# Patient Record
Sex: Female | Born: 1980 | Race: Black or African American | Hispanic: No | Marital: Single | Smoking: Never smoker
Health system: Southern US, Community
[De-identification: ages and names within clinical notes are randomized; demographics above are authoritative.]

## PROBLEM LIST (undated history)

## (undated) ENCOUNTER — Inpatient Hospital Stay (HOSPITAL_COMMUNITY): Payer: Self-pay

## (undated) DIAGNOSIS — N838 Other noninflammatory disorders of ovary, fallopian tube and broad ligament: Secondary | ICD-10-CM

## (undated) DIAGNOSIS — Z789 Other specified health status: Secondary | ICD-10-CM

## (undated) DIAGNOSIS — D473 Essential (hemorrhagic) thrombocythemia: Secondary | ICD-10-CM

## (undated) DIAGNOSIS — R87619 Unspecified abnormal cytological findings in specimens from cervix uteri: Secondary | ICD-10-CM

## (undated) DIAGNOSIS — D75839 Thrombocytosis, unspecified: Secondary | ICD-10-CM

## (undated) DIAGNOSIS — R8789 Other abnormal findings in specimens from female genital organs: Secondary | ICD-10-CM

## (undated) DIAGNOSIS — D259 Leiomyoma of uterus, unspecified: Secondary | ICD-10-CM

## (undated) DIAGNOSIS — D219 Benign neoplasm of connective and other soft tissue, unspecified: Secondary | ICD-10-CM

## (undated) DIAGNOSIS — L918 Other hypertrophic disorders of the skin: Secondary | ICD-10-CM

## (undated) DIAGNOSIS — R87618 Other abnormal cytological findings on specimens from cervix uteri: Secondary | ICD-10-CM

## (undated) DIAGNOSIS — D649 Anemia, unspecified: Secondary | ICD-10-CM

## (undated) DIAGNOSIS — N939 Abnormal uterine and vaginal bleeding, unspecified: Secondary | ICD-10-CM

## (undated) DIAGNOSIS — R519 Headache, unspecified: Secondary | ICD-10-CM

## (undated) HISTORY — DX: Other abnormal cytological findings on specimens from cervix uteri: R87.618

## (undated) HISTORY — DX: Unspecified abnormal cytological findings in specimens from cervix uteri: R87.619

## (undated) HISTORY — DX: Thrombocytosis, unspecified: D75.839

## (undated) HISTORY — DX: Anemia, unspecified: D64.9

## (undated) HISTORY — DX: Other specified health status: Z78.9

## (undated) HISTORY — DX: Other hypertrophic disorders of the skin: L91.8

## (undated) HISTORY — PX: WISDOM TOOTH EXTRACTION: SHX21

## (undated) HISTORY — DX: Benign neoplasm of connective and other soft tissue, unspecified: D21.9

---
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
## 2002-07-17 ENCOUNTER — Other Ambulatory Visit: Admission: RE | Admit: 2002-07-17 | Discharge: 2002-07-17 | Payer: Self-pay | Admitting: *Deleted

## 2004-06-13 ENCOUNTER — Other Ambulatory Visit: Admission: RE | Admit: 2004-06-13 | Discharge: 2004-06-13 | Payer: Self-pay | Admitting: Obstetrics and Gynecology

## 2012-05-01 LAB — OB RESULTS CONSOLE ABO/RH: RH Type: POSITIVE

## 2012-05-01 LAB — OB RESULTS CONSOLE RPR: RPR: NONREACTIVE

## 2012-05-01 LAB — OB RESULTS CONSOLE HIV ANTIBODY (ROUTINE TESTING): HIV: NONREACTIVE

## 2012-05-01 LAB — OB RESULTS CONSOLE RUBELLA ANTIBODY, IGM: Rubella: IMMUNE

## 2012-05-01 LAB — OB RESULTS CONSOLE HEPATITIS B SURFACE ANTIGEN: Hepatitis B Surface Ag: NEGATIVE

## 2012-05-01 LAB — OB RESULTS CONSOLE ANTIBODY SCREEN: Antibody Screen: NEGATIVE

## 2012-06-21 ENCOUNTER — Encounter (HOSPITAL_COMMUNITY): Payer: Self-pay | Admitting: *Deleted

## 2012-06-21 ENCOUNTER — Inpatient Hospital Stay (HOSPITAL_COMMUNITY)
Admission: AD | Admit: 2012-06-21 | Discharge: 2012-06-22 | Disposition: A | Discharge status: Home / Self Care | Payer: BC Managed Care – PPO | Source: Ambulatory Visit | Attending: Obstetrics & Gynecology | Admitting: Obstetrics & Gynecology

## 2012-06-21 DIAGNOSIS — O210 Mild hyperemesis gravidarum: Secondary | ICD-10-CM | POA: Insufficient documentation

## 2012-06-21 DIAGNOSIS — R197 Diarrhea, unspecified: Secondary | ICD-10-CM | POA: Insufficient documentation

## 2012-06-21 DIAGNOSIS — R109 Unspecified abdominal pain: Secondary | ICD-10-CM | POA: Insufficient documentation

## 2012-06-21 DIAGNOSIS — K529 Noninfective gastroenteritis and colitis, unspecified: Secondary | ICD-10-CM

## 2012-06-21 DIAGNOSIS — O99891 Other specified diseases and conditions complicating pregnancy: Secondary | ICD-10-CM | POA: Insufficient documentation

## 2012-06-21 DIAGNOSIS — K5289 Other specified noninfective gastroenteritis and colitis: Secondary | ICD-10-CM | POA: Insufficient documentation

## 2012-06-21 HISTORY — DX: Other specified health status: Z78.9

## 2012-06-21 LAB — URINALYSIS, ROUTINE W REFLEX MICROSCOPIC
Bilirubin Urine: NEGATIVE
Glucose, UA: NEGATIVE mg/dL
Ketones, ur: >80 mg/dL — AB
pH: 6 (ref 5.0–8.0)

## 2012-06-21 MED ORDER — LACTATED RINGERS IV SOLN
25.0000 mg | Freq: Once | INTRAVENOUS | Status: DC
  Filled 2012-06-21: qty 1

## 2012-06-21 MED ORDER — LACTATED RINGERS IV SOLN: INTRAVENOUS | Status: DC

## 2012-06-21 NOTE — MAU Note (Signed)
Patient up to bathroom with assist to void.

## 2012-06-21 NOTE — MAU Note (Signed)
Throwing up since last night. Can't keep down anything. Some diarrhea from 0300-0800 today and none since.

## 2012-06-22 DIAGNOSIS — K5289 Other specified noninfective gastroenteritis and colitis: Secondary | ICD-10-CM

## 2012-06-22 MED ORDER — PROMETHAZINE HCL 25 MG PO TABS: 25.0000 mg | ORAL_TABLET | Freq: Four times a day (QID) | ORAL | Status: DC | PRN

## 2012-06-22 NOTE — Progress Notes (Signed)
Written and verbal d/c instructions given and understanding voiced. 

## 2012-06-22 NOTE — MAU Provider Note (Signed)
  History     CSN: [id number]  Arrival date and time: 06/21/12 1755   First Provider Initiated Contact with Patient 06/21/12 2115      Chief Complaint  Patient presents with  . Emesis During Pregnancy  . Leg Pain   HPI This is a 31 y.o. female at [redacted]w[redacted]d who presents with c/o vomiting since last night. Had some diarrhea today. No fever. Some abdominal cramping. No bleeding. No one else at home sick.   RN Note: Throwing up since last night. Can't keep down anything. Some diarrhea from 0300-0800 today and none since.       OB History    Grav Para Term Preterm Abortions TAB SAB Ect Mult Living   1               Past Medical History  Diagnosis Date  . No pertinent past medical history     No past surgical history on file.  No family history on file.  History  Substance Use Topics  . Smoking status: Never Smoker   . Smokeless tobacco: Not on file  . Alcohol Use: No    Allergies: No Known Allergies  No prescriptions prior to admission    ROS See HPI  Physical Exam   Blood pressure 139/75, pulse 113, temperature 98.5 F (36.9 C), temperature source Oral, resp. rate 20, height 5\' 7"  (1.702 m), weight 221 lb (100.245 kg).  Physical Exam  Constitutional: She is oriented to person, place, and time. She appears well-developed. No distress.  Cardiovascular: Normal rate.   Respiratory: Effort normal.  GI: Soft. She exhibits no distension and no mass. There is tenderness (diffuse, mild). There is no rebound and no guarding.  Musculoskeletal: Normal range of motion.  Neurological: She is alert and oriented to person, place, and time.  Skin: Skin is warm and dry.  Psychiatric: She has a normal mood and affect.    MAU Course  Procedures  MDM Given 2 bags of IVF, one with Phenergan in it.  Able to void with second bag. Feels somewhat better.   Assessment and Plan  A:  SIUP at [redacted]w[redacted]d       Gastroenteritis  P:  Discharge home      Rx Phenergan provided      PO  hydration.      followup in office with Dr Tamela Oddi  [facility] 06/22/2012, 12:21 AM

## 2012-07-31 NOTE — L&D Delivery Note (Signed)
Delivery Note At 3:27 PM a viable female was delivered via Vaginal, Spontaneous Delivery (Presentation: Right Occiput Anterior).  APGAR: 9, 9; weight:  2971 gms. .   Placenta status: Intact, Spontaneous.  Cord: 3 vessels with the following complications: None.  Cord pH: none   Anesthesia: Local  Episiotomy: None Lacerations: 2nd degree Suture Repair: 2.0 chromic Est. Blood Loss (mL): 350  Mom to postpartum.  Baby to nursery-stable.  Carder Yin A 11/18/2012, 3:58 PM

## 2012-08-13 LAB — OB RESULTS CONSOLE HGB/HCT, BLOOD
HCT: 34 %
Hemoglobin: 11.2 g/dL

## 2012-08-13 LAB — OB RESULTS CONSOLE HIV ANTIBODY (ROUTINE TESTING): HIV: NONREACTIVE

## 2012-08-13 LAB — OB RESULTS CONSOLE PLATELET COUNT: Platelets: 428 10*3/uL

## 2012-10-13 ENCOUNTER — Encounter: Payer: Self-pay | Admitting: *Deleted

## 2012-10-16 ENCOUNTER — Ambulatory Visit (INDEPENDENT_AMBULATORY_CARE_PROVIDER_SITE_OTHER): Payer: BC Managed Care – PPO | Admitting: Obstetrics & Gynecology

## 2012-10-16 VITALS — BP 125/79 | Temp 97.1°F | Wt 226.0 lb

## 2012-10-16 DIAGNOSIS — Z3483 Encounter for supervision of other normal pregnancy, third trimester: Secondary | ICD-10-CM

## 2012-10-16 DIAGNOSIS — Z3400 Encounter for supervision of normal first pregnancy, unspecified trimester: Secondary | ICD-10-CM

## 2012-10-16 DIAGNOSIS — Z3403 Encounter for supervision of normal first pregnancy, third trimester: Secondary | ICD-10-CM

## 2012-10-16 LAB — POCT URINALYSIS DIPSTICK
Ketones, UA: NEGATIVE
Leukocytes, UA: NEGATIVE
Nitrite, UA: NEGATIVE
Protein, UA: NEGATIVE
pH, UA: 6

## 2012-10-16 NOTE — Progress Notes (Signed)
Pulse: 88

## 2012-10-16 NOTE — Progress Notes (Signed)
  Subjective:    Stacy [name] is a 32 y.o. female being seen today for her obstetrical visit. She is at [redacted]w[redacted]d gestation. Patient reports no complaints. Fetal movement: normal.  Menstrual History: OB History   Grav Para Term Preterm Abortions TAB SAB Ect Mult Living   1                No LMP recorded. Patient is pregnant.    The following portions of the patient's history were reviewed and updated as appropriate: allergies, current medications, past family history, past medical history, past social history, past surgical history and problem list.  Review of Systems Pertinent items are noted in HPI.   Objective:    BP 125/79  Temp(Src) 97.1 F (36.2 C)  Wt 226 lb (102.513 kg)  BMI 35.39 kg/m2 FHT:  140 BPM  Uterine Size: 38 cm and size greater than dates  Presentation: cephalic     Assessment:    Pregnancy at [redacted]w[redacted]d, see above   Plan:  Growth U/S  Follow up in 2 Weeks.

## 2012-10-16 NOTE — Addendum Note (Signed)
Addended by: Glendell Docker on: 10/16/2012 05:36 PM   Modules accepted: Orders

## 2012-10-16 NOTE — Patient Instructions (Signed)
Prenatal Care  WHAT IS PRENATAL CARE?  Prenatal care means health care during your pregnancy, before your baby is born. Take care of yourself and your baby by:   Getting early prenatal care. If you know you are pregnant, or think you might be pregnant, call your caregiver as soon as possible. Schedule a visit for a general/prenatal examination.  Getting regular prenatal care. Follow your caregiver's schedule for blood and other necessary tests. Do not miss appointments.  Do everything you can to keep yourself and your baby healthy during your pregnancy.  Prenatal care should include evaluation of medical, dietary, educational, psychological, and social needs for the couple and the medical, surgical, and genetic history of the family of the mother and father.  Discuss with your caregiver:  Your medicines, prescription, over-the-counter, and herbal medicines.  Substance abuse, alcohol, smoking, and illegal drugs.  Domestic abuse and violence, if present.  Your immunizations.  Nutrition and diet.  Exercising.  Environment and occupational hazards, at home and at work.  History of sexually transmitted disease, both you and your partner.  Previous pregnancies. WHY IS PRENATAL CARE SO IMPORTANT?  By seeing you regularly, your caregiver has the chance to find problems early, so that they can be treated as soon as possible. Other problems might be prevented. Many studies have shown that early and regular prenatal care is important for the health of both mothers and their babies.  I AM THINKING ABOUT GETTING PREGNANT. HOW CAN I TAKE CARE OF MYSELF?  Taking care of yourself before you get pregnant helps you to have a healthy pregnancy. It also lowers your chances of having a baby born with a birth defect. Here are ways to take care of yourself before you get pregnant:   Eat healthy foods, exercise regularly (30 minutes per day for most days of the week is best), and get enough rest and  sleep. Talk to your caregiver about what kinds of foods and exercises are best for you.  Take 400 micrograms (mcg) of folic acid (one of the B vitamins) every day. The best way to do this is to take a daily multivitamin pill that contains this amount of folic acid. Getting enough of the synthetic (manufactured) form of folic acid every day before you get pregnant and during early pregnancy can help prevent certain birth defects. Many breakfast cereals and other grain products have folic acid added to them, but only certain cereals contain 400 mcg of folic acid per serving. Check the label on your multivitamin or cereal to find the amount of folic acid in the food.  See your caregiver for a complete check up before getting pregnant. Make sure that you have had all your immunization shots, especially for rubella ([place] measles). Rubella can cause serious birth defects. Chickenpox is another illness you want to avoid during pregnancy. If you have had chickenpox and rubella in the past, you should be immune to them.  Tell your caregiver about any prescription or non-prescription medicines (including herbal remedies) you are taking. Some medicines are not safe to take during pregnancy.  Stop smoking cigarettes, drinking alcohol, or taking illegal drugs. Ask your caregiver for help, if you need it. You can also get help with alcohol and drugs by talking with a member of your faith community, a counselor, or a trusted friend.  Discuss and treat any medical, social, or psychological problems before getting pregnant.  Discuss any history of genetic problems in the mother, father, and their families. Do  genetic testing before getting pregnant, when possible.  Discuss any physical or emotional abuse with your caregiver.  Discuss with your caregiver if you might be exposed to harmful chemicals on your job or where you live.  Discuss with your caregiver if you think your job or the hours you work may be  harmful and should be changed.  The father should be involved with the decision making and with all aspects of the pregnancy, labor, and delivery.  If you have medical insurance, make sure you are covered for pregnancy. I JUST FOUND OUT THAT I AM PREGNANT. HOW CAN I TAKE CARE OF MYSELF?  Here are ways to take care of yourself and the precious new life growing inside you:   Continue taking your multivitamin with 400 micrograms (mcg) of folic acid every day.  Get early and regular prenatal care. It does not matter if this is your first pregnancy or if you already have children. It is very important to see a caregiver during your pregnancy. Your caregiver will check at each visit to make sure that you and the baby are healthy. If there are any problems, action can be taken right away to help you and the baby.  Eat a healthy diet that includes:  Fruits.  Vegetables.  Foods low in saturated fat.  Grains.  Calcium-rich foods.  Drink 6 to 8 glasses of liquids a day.  Unless your caregiver tells you not to, try to be physically active for 30 minutes, most days of the week. If you are pressed for time, you can get your activity in through 10 minute segments, three times a day.  If you smoke, drink alcohol, or use drugs, STOP. These can cause long-term damage to your baby. Talk with your caregiver about steps to take to stop smoking. Talk with a member of your faith community, a counselor, a trusted friend, or your caregiver if you are concerned about your alcohol or drug use.  Ask your caregiver before taking any medicine, even over-the-counter medicines. Some medicines are not safe to take during pregnancy.  Get plenty of rest and sleep.  Avoid hot tubs and saunas during pregnancy.  Do not have X-rays taken, unless absolutely necessary and with the recommendation of your caregiver. A lead shield can be placed on your abdomen, to protect the baby when X-rays are taken in other parts of the  body.  Do not empty the cat litter when you are pregnant. It may contain a parasite that causes an infection called toxoplasmosis, which can cause birth defects. Also, use gloves when working in garden areas used by cats.  Do not eat uncooked or undercooked cheese, meats, or fish.  Stay away from toxic chemicals like:  Insecticides.  Solvents (some cleaners or paint thinners).  Lead.  Mercury.  Sexual relations may continue until the end of the pregnancy, unless you have a medical problem or there is a problem with the pregnancy and your caregiver tells you not to.  Do not wear high heel shoes, especially during the second half of the pregnancy. You can lose your balance and fall.  Do not take long trips, unless absolutely necessary. Be sure to see your caregiver before going on the trip.  Do not sit in one position for more than 2 hours, when on a trip.  Take a copy of your medical records when going on a trip.  Know where there is a hospital in the city you are visiting, in case of an  emergency.  Most dangerous household products will have pregnancy warnings on their labels. Ask your caregiver about products if you are unsure.  Limit or eliminate your caffeine intake from coffee, tea, sodas, medicines, and chocolate.  Many women continue working through pregnancy. Staying active might help you stay healthier. If you have a question about the safety or the hours you work at your particular job, talk with your caregiver.  Get informed:  Read books.  Watch videos.  Go to childbirth classes for you and the father.  Talk with experienced moms.  Ask your caregiver about childbirth education classes for you and your partner. Classes can help you and your partner prepare for the birth of your baby.  Ask about a pediatrician (baby doctor) and methods and pain medicine for labor, delivery, and possible Cesarean delivery (C-section). I AM NOT THINKING ABOUT GETTING PREGNANT  RIGHT NOW, BUT HEARD THAT ALL WOMEN SHOULD TAKE FOLIC ACID EVERY DAY?  All women of childbearing age, with even a remote chance of getting pregnant, should try to make sure they get enough folic acid. Many pregnancies are not planned. Many women do not know they are actually pregnant early in their pregnancies, and certain birth defects happen in the very early part of pregnancy. Taking 400 micrograms (mcg) of folic acid every day will help prevent certain birth defects that happen in the early part of pregnancy. If a woman begins taking vitamin pills in the second or third month of pregnancy, it may be too late to prevent birth defects. Folic acid may also have other health benefits for women, besides preventing birth defects.  HOW OFTEN SHOULD I SEE MY CAREGIVER DURING PREGNANCY?  Your caregiver will give you a schedule for your prenatal visits. You will have visits more often as you get closer to the end of your pregnancy. An average pregnancy lasts about 40 weeks.  A typical schedule includes visiting your caregiver:   About once each month, during your first 6 months of pregnancy.  Every 2 weeks, during the next 2 months.  Weekly in the last month, until the delivery date. Your caregiver will probably want to see you more often if:  You are over 35.  Your pregnancy is high risk, because you have certain health problems or problems with the pregnancy, such as:  Diabetes.  High blood pressure.  The baby is not growing on schedule, according to the dates of the pregnancy. Your caregiver will do special tests, to make sure you and the baby are not having any serious problems. WHAT HAPPENS DURING PRENATAL VISITS?   At your first prenatal visit, your caregiver will talk to you about you and your partner's health history and your family's health history, and will do a physical exam.  On your first visit, a physical exam will include checks of your blood pressure, height and weight, and an  exam of your pelvic organs. Your caregiver will do a Pap test if you have not had one recently, and will do cultures of your cervix to make sure there is no infection.  At each visit, there will be tests of your blood, urine, blood pressure, weight, and checking the progress of the baby.  Your caregiver will be able to tell you when to expect that your baby will be born.  Each visit is also a chance for you to learn about staying healthy during pregnancy and for asking questions.  Discuss whether you will be breastfeeding.  At your later prenatal  visits, your caregiver will check how you are doing and how the baby is developing. You may have a number of tests done as your pregnancy progresses.  Ultrasound exams are often used to check on the baby's growth and health.  You may have more urine and blood tests, as well as special tests, if needed. These may include amniocentesis (examine fluid in the pregnancy sac), stress tests (check how baby responds to contractions), biophysical profile (measures fetus well-being). Your caregiver will explain the tests and why they are necessary. I AM IN MY LATE THIRTIES, AND I WANT TO HAVE A CHILD NOW. SHOULD I DO ANYTHING SPECIAL?  As you get older, there is more chance of having a medical problem (high blood pressure), pregnancy problem (preeclampsia, problems with the placenta), miscarriage, or a baby born with a birth defect. However, most women in their late thirties and early forties have healthy babies. See your caregiver on a regular basis before you get pregnant and be sure to go for exams throughout your pregnancy. Your caregiver probably will want to do some special tests to check on you and your baby's health when you are pregnant.  Women today are often delaying having children until later in life, when they are in their thirties and forties. While many women in their thirties and forties have no difficulty getting pregnant, fertility does decline  with age. For women over 40 who cannot get pregnant after 6 months of trying, it is recommended that they see their caregiver for a fertility evaluation. It is not uncommon to have trouble becoming pregnant or experience infertility (inability to become pregnant after trying for one year). If you think that you or your partner may be infertile, you can discuss this with your caregiver. He or she can recommend treatments such as drugs, surgery, or assisted reproductive technology.  Document Released: 07/20/2003 Document Revised: 10/09/2011 Document Reviewed: 06/16/2009 [facility] Patient Information 2013 [place], [place]. Place 32-42 weeks prenatal visit patient instructions here.

## 2012-10-28 ENCOUNTER — Encounter: Payer: BC Managed Care – PPO | Admitting: Obstetrics & Gynecology

## 2012-10-30 ENCOUNTER — Ambulatory Visit (INDEPENDENT_AMBULATORY_CARE_PROVIDER_SITE_OTHER): Payer: BC Managed Care – PPO | Admitting: Obstetrics & Gynecology

## 2012-10-30 ENCOUNTER — Encounter: Payer: Self-pay | Admitting: Obstetrics & Gynecology

## 2012-10-30 ENCOUNTER — Ambulatory Visit (INDEPENDENT_AMBULATORY_CARE_PROVIDER_SITE_OTHER): Payer: BC Managed Care – PPO

## 2012-10-30 VITALS — BP 135/84 | Temp 97.8°F | Wt 227.2 lb

## 2012-10-30 DIAGNOSIS — Z3403 Encounter for supervision of normal first pregnancy, third trimester: Secondary | ICD-10-CM

## 2012-10-30 DIAGNOSIS — Z3400 Encounter for supervision of normal first pregnancy, unspecified trimester: Secondary | ICD-10-CM

## 2012-10-30 DIAGNOSIS — Z3483 Encounter for supervision of other normal pregnancy, third trimester: Secondary | ICD-10-CM

## 2012-10-30 DIAGNOSIS — O3660X0 Maternal care for excessive fetal growth, unspecified trimester, not applicable or unspecified: Secondary | ICD-10-CM

## 2012-10-30 LAB — POCT URINALYSIS DIPSTICK
Bilirubin, UA: NEGATIVE
Blood, UA: NEGATIVE
Nitrite, UA: NEGATIVE
pH, UA: 6

## 2012-10-30 NOTE — Progress Notes (Signed)
Doing well 

## 2012-10-30 NOTE — Progress Notes (Signed)
Pulse: 88

## 2012-10-30 NOTE — Patient Instructions (Addendum)
Patient information: Group B streptococcus and pregnancy (Beyond the Basics)  Authors Karen M Puopolo, MD, PhD Carol J Baker, MD Section Editors Charles J Lockwood, MD Daniel J Sexton, MD Deputy Editor Vanessa A Barss, MD Disclosures  All topics are updated as new evidence becomes available and our peer review process is complete.  Literature review current through: Feb 2014.  This topic last updated: Jan 29, 2012.  INTRODUCTION - Group B streptococcus (GBS) is a bacterium that can cause serious infections in pregnant women and newborn babies. GBS is one of many types of streptococcal bacteria, sometimes called "strep." This article discusses GBS, its effect on pregnant women and infants, and ways to prevent complications of GBS. More detailed information about GBS is available by subscription. (See "Group B streptococcal infection in pregnant women".) WHAT IS GROUP B STREP INFECTION? - GBS is commonly found in the digestive system and the vagina. In healthy adults, GBS is not harmful and does not cause problems. But in pregnant women and newborn infants, being infected with GBS can cause serious illness. Approximately one in three to four pregnant women in the [place] carries GBS in their gastrointestinal system and/or in their vagina. Carrying GBS is not the same as being infected. Carriers are not sick and do not need treatment during pregnancy. There is no treatment that can stop you from carrying GBS.  Pregnant women who are carriers of GBS infrequently become infected with GBS. GBS can cause urinary tract infections, infection of the amniotic fluid (bag of water), and infection of the uterus after delivery. GBS infections during pregnancy may lead to preterm labor.  Pregnant women who carry GBS can pass on the bacteria to their newborns, and some of those babies become infected with GBS. Newborns who are infected with GBS can develop pneumonia (lung infection), septicemia (blood infection), or  meningitis (infection of the lining of the brain and spinal cord). These complications can be prevented by giving intravenous antibiotics during labor to any woman who is at risk of GBS infection. You are at risk of GBS infection if: You have a urine culture during your current pregnancy showing GBS  You have a vaginal and rectal culture during your current pregnancy showing GBS  You had an infant infected with GBS in the past GROUP B STREP PREVENTION - Most doctors and nurses recommend a urine culture early in your pregnancy to be sure that you do not have a bladder infection without symptoms. If you urine culture shows GBS or other bacteria, you may be treated with an antibiotic. If you have symptoms of urinary infection, such as pain with urination, any time during your pregnancy, a urine culture is done. If GBS grows from the urine culture, it should be treated with an antibiotic, and you should also receive intravenous antibiotics during labor. Expert groups recommend that all pregnant women have a GBS culture at 35 to 37 weeks of pregnancy. The culture is done by swabbing the vagina and rectum. If your GBS culture is positive, you will be given an intravenous antibiotic during labor. If you have preterm labor, the culture is done then and an intravenous antibiotic is given until the baby is born or the labor is stopped by your health care provider. If you have a positive GBS culture and you have an allergy to penicillin, be sure your doctor and nurse are aware of this allergy and tell them what happened with the allergy. If you had only a rash or itching, this   is not a serious allergy, and you can receive a common drug related to the penicillin. If you had a serious allergy (for example, trouble breathing, swelling of your face) you may need an additional test to determine which antibiotic should be used during labor. Being treated with an antibiotic during labor greatly reduces the chance that you or  your newborn will develop infections related to GBS. It is important to note that young infants up to age 3 months can also develop septicemia, meningitis and other serious infections from GBS. Being treated with an antibiotic during labor does not reduce the chance that your baby will develop this later type of infection. There is currently no known way of preventing this later-onset GBS disease. WHERE TO GET MORE INFORMATION - Your healthcare provider is the best source of information for questions and concerns related to your medical problem.  

## 2012-10-31 ENCOUNTER — Telehealth: Payer: Self-pay | Admitting: *Deleted

## 2012-10-31 NOTE — Telephone Encounter (Signed)
Spoke to patient explain the reason for hospital [place] and the reason for the mix up with notification of need for appointment. (Working in new system- doctor unaware message not sent to staff) Patient understands. Apology accepted.

## 2012-11-04 ENCOUNTER — Encounter: Payer: Self-pay | Admitting: Obstetrics & Gynecology

## 2012-11-04 ENCOUNTER — Ambulatory Visit (HOSPITAL_COMMUNITY)
Admission: RE | Admit: 2012-11-04 | Discharge: 2012-11-04 | Disposition: A | Discharge status: Home / Self Care | Payer: BC Managed Care – PPO | Source: Ambulatory Visit | Attending: Obstetrics & Gynecology | Admitting: Obstetrics & Gynecology

## 2012-11-04 DIAGNOSIS — O365990 Maternal care for other known or suspected poor fetal growth, unspecified trimester, not applicable or unspecified: Secondary | ICD-10-CM | POA: Insufficient documentation

## 2012-11-04 DIAGNOSIS — Z3689 Encounter for other specified antenatal screening: Secondary | ICD-10-CM | POA: Insufficient documentation

## 2012-11-05 ENCOUNTER — Encounter: Payer: Self-pay | Admitting: Obstetrics & Gynecology

## 2012-11-05 LAB — US OB DETAIL + 14 WK

## 2012-11-06 ENCOUNTER — Ambulatory Visit (INDEPENDENT_AMBULATORY_CARE_PROVIDER_SITE_OTHER): Payer: BC Managed Care – PPO | Admitting: Obstetrics & Gynecology

## 2012-11-06 ENCOUNTER — Other Ambulatory Visit (INDEPENDENT_AMBULATORY_CARE_PROVIDER_SITE_OTHER): Payer: BC Managed Care – PPO

## 2012-11-06 ENCOUNTER — Ambulatory Visit: Payer: BC Managed Care – PPO | Admitting: Obstetrics & Gynecology

## 2012-11-06 ENCOUNTER — Other Ambulatory Visit: Payer: Self-pay | Admitting: Obstetrics & Gynecology

## 2012-11-06 VITALS — BP 119/86 | Temp 98.1°F | Wt 229.8 lb

## 2012-11-06 DIAGNOSIS — O288 Other abnormal findings on antenatal screening of mother: Secondary | ICD-10-CM

## 2012-11-06 DIAGNOSIS — Z3403 Encounter for supervision of normal first pregnancy, third trimester: Secondary | ICD-10-CM

## 2012-11-06 DIAGNOSIS — O289 Unspecified abnormal findings on antenatal screening of mother: Secondary | ICD-10-CM

## 2012-11-06 DIAGNOSIS — Z3400 Encounter for supervision of normal first pregnancy, unspecified trimester: Secondary | ICD-10-CM

## 2012-11-06 LAB — POCT URINALYSIS DIPSTICK
Blood, UA: NEGATIVE
Glucose, UA: NEGATIVE
Nitrite, UA: NEGATIVE
Urobilinogen, UA: NEGATIVE
pH, UA: 6

## 2012-11-06 NOTE — Progress Notes (Signed)
Minimallly reactive NST.

## 2012-11-06 NOTE — Progress Notes (Signed)
Pulse: 90

## 2012-11-07 ENCOUNTER — Encounter (HOSPITAL_COMMUNITY): Payer: Self-pay | Admitting: *Deleted

## 2012-11-07 ENCOUNTER — Telehealth (HOSPITAL_COMMUNITY): Payer: Self-pay | Admitting: *Deleted

## 2012-11-07 NOTE — Telephone Encounter (Signed)
Preadmission screen  

## 2012-11-08 ENCOUNTER — Other Ambulatory Visit: Payer: Self-pay | Admitting: *Deleted

## 2012-11-11 ENCOUNTER — Ambulatory Visit (INDEPENDENT_AMBULATORY_CARE_PROVIDER_SITE_OTHER): Payer: BC Managed Care – PPO | Admitting: Obstetrics & Gynecology

## 2012-11-11 ENCOUNTER — Encounter: Payer: Self-pay | Admitting: *Deleted

## 2012-11-11 VITALS — BP 120/79 | Temp 97.8°F | Wt 236.0 lb

## 2012-11-11 DIAGNOSIS — O365990 Maternal care for other known or suspected poor fetal growth, unspecified trimester, not applicable or unspecified: Secondary | ICD-10-CM

## 2012-11-11 DIAGNOSIS — O0990 Supervision of high risk pregnancy, unspecified, unspecified trimester: Secondary | ICD-10-CM

## 2012-11-11 LAB — POCT URINALYSIS DIPSTICK
Bilirubin, UA: NEGATIVE
Blood, UA: NEGATIVE
Glucose, UA: NEGATIVE
Ketones, UA: NEGATIVE
Nitrite, UA: NEGATIVE
Spec Grav, UA: 1.01

## 2012-11-11 NOTE — Progress Notes (Signed)
P-90 

## 2012-11-11 NOTE — Patient Instructions (Addendum)
Fetal Movement Counts Patient Name: __________________________________________________ Patient Due Date: ____________________ Kick counts is highly recommended in high risk pregnancies, but it is a good idea for every pregnant woman to do. Start counting fetal movements at 28 weeks of the pregnancy. Fetal movements increase after eating a full meal or eating or drinking something sweet (the blood sugar is higher). It is also important to drink plenty of fluids (well hydrated) before doing the count. Lie on your left side because it helps with the circulation or you can sit in a comfortable chair with your arms over your belly (abdomen) with no distractions around you. DOING THE COUNT  Try to do the count the same time of day each time you do it.  Mark the day and time, then see how long it takes for you to feel 10 movements (kicks, flutters, swishes, rolls). You should have at least 10 movements within 2 hours. You will most likely feel 10 movements in much less than 2 hours. If you do not, wait an hour and count again. After a couple of days you will see a pattern.  What you are looking for is a change in the pattern or not enough counts in 2 hours. Is it taking longer in time to reach 10 movements? SEEK MEDICAL CARE IF:  You feel less than 10 counts in 2 hours. Tried twice.  No movement in one hour.  The pattern is changing or taking longer each day to reach 10 counts in 2 hours.  You feel the baby is not moving as it usually does. Date: ____________ Movements: ____________ Start time: ____________ Finish time: ____________  Date: ____________ Movements: ____________ Start time: ____________ Finish time: ____________ Date: ____________ Movements: ____________ Start time: ____________ Finish time: ____________ Date: ____________ Movements: ____________ Start time: ____________ Finish time: ____________ Date: ____________ Movements: ____________ Start time: ____________ Finish time:  ____________ Date: ____________ Movements: ____________ Start time: ____________ Finish time: ____________ Date: ____________ Movements: ____________ Start time: ____________ Finish time: ____________ Date: ____________ Movements: ____________ Start time: ____________ Finish time: ____________  Date: ____________ Movements: ____________ Start time: ____________ Finish time: ____________ Date: ____________ Movements: ____________ Start time: ____________ Finish time: ____________ Date: ____________ Movements: ____________ Start time: ____________ Finish time: ____________ Date: ____________ Movements: ____________ Start time: ____________ Finish time: ____________ Date: ____________ Movements: ____________ Start time: ____________ Finish time: ____________ Date: ____________ Movements: ____________ Start time: ____________ Finish time: ____________ Date: ____________ Movements: ____________ Start time: ____________ Finish time: ____________  Date: ____________ Movements: ____________ Start time: ____________ Finish time: ____________ Date: ____________ Movements: ____________ Start time: ____________ Finish time: ____________ Date: ____________ Movements: ____________ Start time: ____________ Finish time: ____________ Date: ____________ Movements: ____________ Start time: ____________ Finish time: ____________ Date: ____________ Movements: ____________ Start time: ____________ Finish time: ____________ Date: ____________ Movements: ____________ Start time: ____________ Finish time: ____________ Date: ____________ Movements: ____________ Start time: ____________ Finish time: ____________  Date: ____________ Movements: ____________ Start time: ____________ Finish time: ____________ Date: ____________ Movements: ____________ Start time: ____________ Finish time: ____________ Date: ____________ Movements: ____________ Start time: ____________ Finish time: ____________ Date: ____________ Movements:  ____________ Start time: ____________ Finish time: ____________ Date: ____________ Movements: ____________ Start time: ____________ Finish time: ____________ Date: ____________ Movements: ____________ Start time: ____________ Finish time: ____________ Date: ____________ Movements: ____________ Start time: ____________ Finish time: ____________  Date: ____________ Movements: ____________ Start time: ____________ Finish time: ____________ Date: ____________ Movements: ____________ Start time: ____________ Finish time: ____________ Date: ____________ Movements: ____________ Start time: ____________ Finish time: ____________ Date: ____________ Movements:   ____________ Start time: ____________ Finish time: ____________ Date: ____________ Movements: ____________ Start time: ____________ Finish time: ____________ Date: ____________ Movements: ____________ Start time: ____________ Finish time: ____________ Date: ____________ Movements: ____________ Start time: ____________ Finish time: ____________  Date: ____________ Movements: ____________ Start time: ____________ Finish time: ____________ Date: ____________ Movements: ____________ Start time: ____________ Finish time: ____________ Date: ____________ Movements: ____________ Start time: ____________ Finish time: ____________ Date: ____________ Movements: ____________ Start time: ____________ Finish time: ____________ Date: ____________ Movements: ____________ Start time: ____________ Finish time: ____________ Date: ____________ Movements: ____________ Start time: ____________ Finish time: ____________ Date: ____________ Movements: ____________ Start time: ____________ Finish time: ____________  Date: ____________ Movements: ____________ Start time: ____________ Finish time: ____________ Date: ____________ Movements: ____________ Start time: ____________ Finish time: ____________ Date: ____________ Movements: ____________ Start time: ____________ Finish  time: ____________ Date: ____________ Movements: ____________ Start time: ____________ Finish time: ____________ Date: ____________ Movements: ____________ Start time: ____________ Finish time: ____________ Date: ____________ Movements: ____________ Start time: ____________ Finish time: ____________ Date: ____________ Movements: ____________ Start time: ____________ Finish time: ____________  Date: ____________ Movements: ____________ Start time: ____________ Finish time: ____________ Date: ____________ Movements: ____________ Start time: ____________ Finish time: ____________ Date: ____________ Movements: ____________ Start time: ____________ Finish time: ____________ Date: ____________ Movements: ____________ Start time: ____________ Finish time: ____________ Date: ____________ Movements: ____________ Start time: ____________ Finish time: ____________ Date: ____________ Movements: ____________ Start time: ____________ Finish time: ____________ Document Released: 08/16/2006 Document Revised: 10/09/2011 Document Reviewed: 02/16/2009 [employer] Patient Information 2013 [employer].  

## 2012-11-12 ENCOUNTER — Encounter: Payer: Self-pay | Admitting: Obstetrics & Gynecology

## 2012-11-12 LAB — CBC
HCT: 29.9 % — ABNORMAL LOW (ref 36.0–46.0)
MCH: 27.8 pg (ref 26.0–34.0)
MCHC: 32.8 g/dL (ref 30.0–36.0)
MCV: 84.7 fL (ref 78.0–100.0)
RDW: 14.4 % (ref 11.5–15.5)

## 2012-11-12 LAB — PROTEIN / CREATININE RATIO, URINE
Creatinine, Urine: 85 mg/dL
Protein Creatinine Ratio: 0.06 (ref <0.15)
Total Protein, Urine: 5 mg/dL

## 2012-11-12 NOTE — Progress Notes (Signed)
NST reassuring.  Check PIH labs.

## 2012-11-14 ENCOUNTER — Ambulatory Visit (INDEPENDENT_AMBULATORY_CARE_PROVIDER_SITE_OTHER): Payer: BC Managed Care – PPO | Admitting: Obstetrics

## 2012-11-14 ENCOUNTER — Encounter: Payer: Self-pay | Admitting: Obstetrics

## 2012-11-14 VITALS — BP 139/76 | Temp 97.3°F | Wt 232.0 lb

## 2012-11-14 DIAGNOSIS — Z3400 Encounter for supervision of normal first pregnancy, unspecified trimester: Secondary | ICD-10-CM

## 2012-11-14 DIAGNOSIS — O365990 Maternal care for other known or suspected poor fetal growth, unspecified trimester, not applicable or unspecified: Secondary | ICD-10-CM

## 2012-11-14 LAB — POCT URINALYSIS DIPSTICK
Bilirubin, UA: NEGATIVE
Glucose, UA: NEGATIVE
Ketones, UA: NEGATIVE
Spec Grav, UA: 1.015
Urobilinogen, UA: NEGATIVE

## 2012-11-14 NOTE — Progress Notes (Signed)
Pulse-74 No complaints. 

## 2012-11-17 ENCOUNTER — Encounter (HOSPITAL_COMMUNITY): Payer: Self-pay

## 2012-11-17 ENCOUNTER — Inpatient Hospital Stay (HOSPITAL_COMMUNITY)
Admission: RE | Admit: 2012-11-17 | Discharge: 2012-11-20 | DRG: 372 | Disposition: A | Discharge status: Home / Self Care | Payer: BC Managed Care – PPO | Source: Ambulatory Visit | Attending: Obstetrics & Gynecology | Admitting: Obstetrics & Gynecology

## 2012-11-17 DIAGNOSIS — O365990 Maternal care for other known or suspected poor fetal growth, unspecified trimester, not applicable or unspecified: Principal | ICD-10-CM | POA: Diagnosis present

## 2012-11-17 DIAGNOSIS — O99810 Abnormal glucose complicating pregnancy: Secondary | ICD-10-CM | POA: Diagnosis present

## 2012-11-17 DIAGNOSIS — Z22330 Carrier of Group B streptococcus: Secondary | ICD-10-CM

## 2012-11-17 DIAGNOSIS — O99892 Other specified diseases and conditions complicating childbirth: Secondary | ICD-10-CM | POA: Diagnosis present

## 2012-11-17 DIAGNOSIS — O139 Gestational [pregnancy-induced] hypertension without significant proteinuria, unspecified trimester: Secondary | ICD-10-CM | POA: Diagnosis present

## 2012-11-17 LAB — COMPREHENSIVE METABOLIC PANEL
ALT: 13 U/L (ref 0–35)
Albumin: 2.6 g/dL — ABNORMAL LOW (ref 3.5–5.2)
Calcium: 9.1 mg/dL (ref 8.4–10.5)
GFR calc Af Amer: >90 mL/min (ref >90)
Glucose, Bld: 79 mg/dL (ref 70–99)
Sodium: 135 mEq/L (ref 135–145)
Total Protein: 6.2 g/dL (ref 6.0–8.3)

## 2012-11-17 LAB — URIC ACID: Uric Acid, Serum: 4.3 mg/dL (ref 2.4–7.0)

## 2012-11-17 LAB — LACTATE DEHYDROGENASE: LDH: 161 U/L (ref 94–250)

## 2012-11-17 LAB — CBC
HCT: 30.5 % — ABNORMAL LOW (ref 36.0–46.0)
Hemoglobin: 9.9 g/dL — ABNORMAL LOW (ref 12.0–15.0)
MCH: 28 pg (ref 26.0–34.0)
MCHC: 32.5 g/dL (ref 30.0–36.0)
MCV: 86.2 fL (ref 78.0–100.0)
Platelets: 339 10*3/uL (ref 150–400)
RBC: 3.54 MIL/uL — ABNORMAL LOW (ref 3.87–5.11)
RDW: 14 % (ref 11.5–15.5)
WBC: 8.1 10*3/uL (ref 4.0–10.5)

## 2012-11-17 MED ORDER — BUTORPHANOL TARTRATE 1 MG/ML IJ SOLN
1.0000 mg | INTRAMUSCULAR | Status: DC | PRN
  Administered 2012-11-18: 1 mg via INTRAVENOUS
  Filled 2012-11-17: qty 1

## 2012-11-17 MED ORDER — MAGNESIUM SULFATE 40 G IN LACTATED RINGERS - SIMPLE
2.0000 g/h | INTRAVENOUS | Status: DC
  Administered 2012-11-18: 2 g/h via INTRAVENOUS
  Filled 2012-11-17 (×2): qty 500

## 2012-11-17 MED ORDER — OXYTOCIN 40 UNITS IN LACTATED RINGERS INFUSION - SIMPLE MED
62.5000 mL/h | INTRAVENOUS | Status: DC
  Administered 2012-11-18: 62.5 mL/h via INTRAVENOUS
  Filled 2012-11-17: qty 1000

## 2012-11-17 MED ORDER — PENICILLIN G POTASSIUM 5000000 UNITS IJ SOLR
5.0000 10*6.[IU] | Freq: Once | INTRAVENOUS | Status: AC
  Administered 2012-11-18: 5 10*6.[IU] via INTRAVENOUS
  Filled 2012-11-17: qty 5

## 2012-11-17 MED ORDER — PENICILLIN G POTASSIUM 5000000 UNITS IJ SOLR
2.5000 10*6.[IU] | INTRAVENOUS | Status: DC
  Administered 2012-11-18 (×2): 2.5 10*6.[IU] via INTRAVENOUS
  Filled 2012-11-17 (×5): qty 2.5

## 2012-11-17 MED ORDER — IBUPROFEN 600 MG PO TABS
600.0000 mg | ORAL_TABLET | Freq: Four times a day (QID) | ORAL | Status: DC | PRN
  Administered 2012-11-18: 600 mg via ORAL
  Filled 2012-11-17: qty 1

## 2012-11-17 MED ORDER — LACTATED RINGERS IV SOLN: 500.0000 mL | INTRAVENOUS | Status: DC | PRN

## 2012-11-17 MED ORDER — ONDANSETRON HCL 4 MG/2ML IJ SOLN
4.0000 mg | Freq: Four times a day (QID) | INTRAMUSCULAR | Status: DC | PRN
  Administered 2012-11-18: 4 mg via INTRAVENOUS
  Filled 2012-11-17: qty 2

## 2012-11-17 MED ORDER — LIDOCAINE HCL (PF) 1 % IJ SOLN
30.0000 mL | INTRAMUSCULAR | Status: DC | PRN
  Administered 2012-11-18: 30 mL via SUBCUTANEOUS
  Filled 2012-11-17 (×2): qty 30

## 2012-11-17 MED ORDER — ZOLPIDEM TARTRATE 5 MG PO TABS
5.0000 mg | ORAL_TABLET | Freq: Every evening | ORAL | Status: DC | PRN
  Administered 2012-11-17: 5 mg via ORAL
  Filled 2012-11-17: qty 1

## 2012-11-17 MED ORDER — OXYTOCIN BOLUS FROM INFUSION: 500.0000 mL | INTRAVENOUS | Status: DC

## 2012-11-17 MED ORDER — MISOPROSTOL 25 MCG QUARTER TABLET
25.0000 ug | ORAL_TABLET | ORAL | Status: DC | PRN
  Administered 2012-11-17: 25 ug via VAGINAL
  Filled 2012-11-17: qty 1
  Filled 2012-11-17: qty 0.25

## 2012-11-17 MED ORDER — LACTATED RINGERS IV SOLN
INTRAVENOUS | Status: DC
  Administered 2012-11-17 – 2012-11-18 (×2): via INTRAVENOUS

## 2012-11-17 MED ORDER — TERBUTALINE SULFATE 1 MG/ML IJ SOLN: 0.2500 mg | Freq: Once | INTRAMUSCULAR | Status: AC | PRN

## 2012-11-17 MED ORDER — ACETAMINOPHEN 325 MG PO TABS: 650.0000 mg | ORAL_TABLET | ORAL | Status: DC | PRN

## 2012-11-17 MED ORDER — CITRIC ACID-SODIUM CITRATE 334-500 MG/5ML PO SOLN
30.0000 mL | ORAL | Status: DC | PRN
  Filled 2012-11-17: qty 15

## 2012-11-17 MED ORDER — MAGNESIUM SULFATE BOLUS VIA INFUSION
4.0000 g | Freq: Once | INTRAVENOUS | Status: AC
  Administered 2012-11-17: 4 g via INTRAVENOUS
  Filled 2012-11-17: qty 500

## 2012-11-17 MED ORDER — HYDROXYZINE HCL 50 MG PO TABS
50.0000 mg | ORAL_TABLET | Freq: Four times a day (QID) | ORAL | Status: DC | PRN
  Filled 2012-11-17: qty 1

## 2012-11-17 MED ORDER — OXYCODONE-ACETAMINOPHEN 5-325 MG PO TABS: 1.0000 | ORAL_TABLET | ORAL | Status: DC | PRN

## 2012-11-17 NOTE — Progress Notes (Signed)
Dr.  Gaynell Face notified of pt's elevated bps. Notified pt denies headache, blurred vision, epigastric pain. Orders received for [facility] labs and begin Magnesium Sulfate 4 gram bolus followed by 2 grams/hr

## 2012-11-17 NOTE — Progress Notes (Signed)
Dr. Gaynell Face notified of Dr. Marcia Brash pt here for IOL at 39.2 weeks, a G1P0, with IUGR, no new orders received

## 2012-11-18 ENCOUNTER — Encounter (HOSPITAL_COMMUNITY): Payer: Self-pay | Admitting: Anesthesiology

## 2012-11-18 ENCOUNTER — Encounter (HOSPITAL_COMMUNITY)
Admission: RE | Disposition: A | Discharge status: Home / Self Care | Payer: Self-pay | Source: Ambulatory Visit | Attending: Obstetrics & Gynecology

## 2012-11-18 ENCOUNTER — Encounter: Payer: Self-pay | Admitting: Obstetrics

## 2012-11-18 ENCOUNTER — Encounter (HOSPITAL_COMMUNITY): Payer: Self-pay

## 2012-11-18 DIAGNOSIS — O99810 Abnormal glucose complicating pregnancy: Secondary | ICD-10-CM | POA: Diagnosis present

## 2012-11-18 LAB — ABO/RH: ABO/RH(D): O POS

## 2012-11-18 LAB — CBC
HCT: 31.6 % — ABNORMAL LOW (ref 36.0–46.0)
RDW: 14.1 % (ref 11.5–15.5)
WBC: 10.9 10*3/uL — ABNORMAL HIGH (ref 4.0–10.5)

## 2012-11-18 LAB — PREPARE RBC (CROSSMATCH)

## 2012-11-18 SURGERY — Surgical Case
Anesthesia: Regional

## 2012-11-18 MED ORDER — ONDANSETRON HCL 4 MG PO TABS: 4.0000 mg | ORAL_TABLET | ORAL | Status: DC | PRN

## 2012-11-18 MED ORDER — FENTANYL 2.5 MCG/ML BUPIVACAINE 1/10 % EPIDURAL INFUSION (WH - ANES)
14.0000 mL/h | INTRAMUSCULAR | Status: DC | PRN
  Filled 2012-11-18: qty 125

## 2012-11-18 MED ORDER — SENNOSIDES-DOCUSATE SODIUM 8.6-50 MG PO TABS
2.0000 | ORAL_TABLET | Freq: Every day | ORAL | Status: DC
  Administered 2012-11-19: 2 via ORAL

## 2012-11-18 MED ORDER — IBUPROFEN 600 MG PO TABS
600.0000 mg | ORAL_TABLET | Freq: Four times a day (QID) | ORAL | Status: DC
  Administered 2012-11-18 – 2012-11-20 (×8): 600 mg via ORAL
  Filled 2012-11-18 (×8): qty 1

## 2012-11-18 MED ORDER — ZOLPIDEM TARTRATE 5 MG PO TABS: 5.0000 mg | ORAL_TABLET | Freq: Every evening | ORAL | Status: DC | PRN

## 2012-11-18 MED ORDER — LANOLIN HYDROUS EX OINT: TOPICAL_OINTMENT | CUTANEOUS | Status: DC | PRN

## 2012-11-18 MED ORDER — PHENYLEPHRINE 40 MCG/ML (10ML) SYRINGE FOR IV PUSH (FOR BLOOD PRESSURE SUPPORT)
80.0000 ug | PREFILLED_SYRINGE | INTRAVENOUS | Status: DC | PRN
  Filled 2012-11-18: qty 2
  Filled 2012-11-18: qty 5

## 2012-11-18 MED ORDER — OXYTOCIN 40 UNITS IN LACTATED RINGERS INFUSION - SIMPLE MED: 62.5000 mL/h | INTRAVENOUS | Status: DC | PRN

## 2012-11-18 MED ORDER — PHENYLEPHRINE 40 MCG/ML (10ML) SYRINGE FOR IV PUSH (FOR BLOOD PRESSURE SUPPORT)
80.0000 ug | PREFILLED_SYRINGE | INTRAVENOUS | Status: DC | PRN
  Filled 2012-11-18: qty 2

## 2012-11-18 MED ORDER — WITCH HAZEL-GLYCERIN EX PADS: 1.0000 "application " | MEDICATED_PAD | CUTANEOUS | Status: DC | PRN

## 2012-11-18 MED ORDER — LACTATED RINGERS IV SOLN
INTRAVENOUS | Status: DC
  Administered 2012-11-18 – 2012-11-19 (×2): via INTRAVENOUS

## 2012-11-18 MED ORDER — SIMETHICONE 80 MG PO CHEW: 80.0000 mg | CHEWABLE_TABLET | ORAL | Status: DC | PRN

## 2012-11-18 MED ORDER — EPHEDRINE 5 MG/ML INJ
10.0000 mg | INTRAVENOUS | Status: DC | PRN
  Filled 2012-11-18: qty 2

## 2012-11-18 MED ORDER — BENZOCAINE-MENTHOL 20-0.5 % EX AERO
1.0000 "application " | INHALATION_SPRAY | CUTANEOUS | Status: DC | PRN
  Filled 2012-11-18 (×3): qty 56

## 2012-11-18 MED ORDER — LACTATED RINGERS IV SOLN
500.0000 mL | Freq: Once | INTRAVENOUS | Status: AC
  Administered 2012-11-18: 15:00:00 via INTRAVENOUS

## 2012-11-18 MED ORDER — DIPHENHYDRAMINE HCL 25 MG PO CAPS: 25.0000 mg | ORAL_CAPSULE | Freq: Four times a day (QID) | ORAL | Status: DC | PRN

## 2012-11-18 MED ORDER — TETANUS-DIPHTH-ACELL PERTUSSIS 5-2.5-18.5 LF-MCG/0.5 IM SUSP
0.5000 mL | Freq: Once | INTRAMUSCULAR | Status: DC
  Filled 2012-11-18: qty 0.5

## 2012-11-18 MED ORDER — DIPHENHYDRAMINE HCL 50 MG/ML IJ SOLN: 12.5000 mg | INTRAMUSCULAR | Status: DC | PRN

## 2012-11-18 MED ORDER — OXYCODONE-ACETAMINOPHEN 5-325 MG PO TABS
1.0000 | ORAL_TABLET | ORAL | Status: DC | PRN
  Administered 2012-11-19 (×2): 1 via ORAL
  Filled 2012-11-18 (×3): qty 1

## 2012-11-18 MED ORDER — ONDANSETRON HCL 4 MG/2ML IJ SOLN: 4.0000 mg | INTRAMUSCULAR | Status: DC | PRN

## 2012-11-18 MED ORDER — PRENATAL MULTIVITAMIN CH
1.0000 | ORAL_TABLET | Freq: Every day | ORAL | Status: DC
  Administered 2012-11-19 – 2012-11-20 (×2): 1 via ORAL
  Filled 2012-11-18 (×2): qty 1

## 2012-11-18 MED ORDER — EPHEDRINE 5 MG/ML INJ
10.0000 mg | INTRAVENOUS | Status: DC | PRN
  Filled 2012-11-18: qty 2
  Filled 2012-11-18: qty 4

## 2012-11-18 MED ORDER — DIBUCAINE 1 % RE OINT: 1.0000 "application " | TOPICAL_OINTMENT | RECTAL | Status: DC | PRN

## 2012-11-18 SURGICAL SUPPLY — 39 items
BENZOIN TINCTURE PRP APPL 2/3 (GAUZE/BANDAGES/DRESSINGS) IMPLANT
CANISTER WOUND CARE 500ML ATS (WOUND CARE) IMPLANT
CLOTH BEACON ORANGE TIMEOUT ST (SAFETY) IMPLANT
CONTAINER PREFILL 10% NBF 15ML (MISCELLANEOUS) IMPLANT
DRAPE LG THREE QUARTER DISP (DRAPES) IMPLANT
DRSG OPSITE POSTOP 4X10 (GAUZE/BANDAGES/DRESSINGS) IMPLANT
DRSG VAC ATS LRG SENSATRAC (GAUZE/BANDAGES/DRESSINGS) IMPLANT
DRSG VAC ATS MED SENSATRAC (GAUZE/BANDAGES/DRESSINGS) IMPLANT
DRSG VAC ATS SM SENSATRAC (GAUZE/BANDAGES/DRESSINGS) IMPLANT
DURAPREP 26ML APPLICATOR (WOUND CARE) IMPLANT
ELECT REM PT RETURN 9FT ADLT (ELECTROSURGICAL)
ELECTRODE REM PT RTRN 9FT ADLT (ELECTROSURGICAL) IMPLANT
EXTRACTOR VACUUM M CUP 4 TUBE (SUCTIONS) IMPLANT
GLOVE BIO SURGEON STRL SZ 6.5 (GLOVE) IMPLANT
GOWN STRL REIN XL XLG (GOWN DISPOSABLE) IMPLANT
KIT ABG SYR 3ML LUER SLIP (SYRINGE) IMPLANT
NEEDLE HYPO 25X5/8 SAFETYGLIDE (NEEDLE) IMPLANT
NS IRRIG 1000ML POUR BTL (IV SOLUTION) IMPLANT
PACK C SECTION WH (CUSTOM PROCEDURE TRAY) IMPLANT
PAD OB MATERNITY 4.3X12.25 (PERSONAL CARE ITEMS) IMPLANT
RTRCTR C-SECT PINK 25CM LRG (MISCELLANEOUS) IMPLANT
SCRUB PCMX 4 OZ (MISCELLANEOUS) IMPLANT
SLEEVE SCD COMPRESS KNEE MED (MISCELLANEOUS) IMPLANT
STAPLER VISISTAT 35W (STAPLE) IMPLANT
STRIP CLOSURE SKIN 1/2X4 (GAUZE/BANDAGES/DRESSINGS) IMPLANT
SUT MNCRL 0 VIOLET CTX 36 (SUTURE) IMPLANT
SUT MNCRL AB 3-0 PS2 27 (SUTURE) IMPLANT
SUT MONOCRYL 0 CTX 36 (SUTURE)
SUT PDS AB 0 CTX 36 PDP370T (SUTURE) IMPLANT
SUT PLAIN 0 NONE (SUTURE) IMPLANT
SUT VIC AB 0 CTXB 36 (SUTURE) IMPLANT
SUT VIC AB 2-0 CT1 (SUTURE) IMPLANT
SUT VIC AB 2-0 CT1 27 (SUTURE)
SUT VIC AB 2-0 CT1 TAPERPNT 27 (SUTURE) IMPLANT
SUT VIC AB 2-0 SH 27 (SUTURE)
SUT VIC AB 2-0 SH 27XBRD (SUTURE) IMPLANT
TOWEL OR 17X24 6PK STRL BLUE (TOWEL DISPOSABLE) IMPLANT
TRAY FOLEY CATH 14FR (SET/KITS/TRAYS/PACK) IMPLANT
WATER STERILE IRR 1000ML POUR (IV SOLUTION) IMPLANT

## 2012-11-18 NOTE — H&P (Signed)
Stacy [name] is a 32 y.o. female presenting for IOL. Maternal Medical History:  Reason for admission: Nausea. IOL secondary to IUGR < 10%.  U/A dopplers, antenatal testing reassuring.  Fetal activity: Perceived fetal activity is normal.    Prenatal complications: IUGR.   Prenatal Complications - Diabetes: none.    OB History   Grav Para Term Preterm Abortions TAB SAB Ect Mult Living   1              Past Medical History  Diagnosis Date  . No pertinent past medical history   . Medical history non-contributory    Past Surgical History  Procedure Laterality Date  . Wisdom tooth extraction     Family History: family history includes Cancer in her maternal grandmother. Social History:  reports that she has never smoked. She has never used smokeless tobacco. She reports that she does not drink alcohol or use illicit drugs.   Prenatal Transfer Tool  Maternal Diabetes: No Maternal Ultrasounds/Referrals: Abnormal:  Findings:   IUGR Fetal Ultrasounds or other Referrals:  Referred to Materal Fetal Medicine  Maternal Substance Abuse:  No Significant Maternal Medications:  None Significant Maternal Lab Results:  Lab values include: Group B Strep positive Other Comments:  Gestational hypertension  Review of Systems  Constitutional: Negative for fever.  Eyes: Negative for blurred vision.  Respiratory: Negative for shortness of breath.   Gastrointestinal: Negative for nausea and vomiting.  Skin: Negative for rash.  Neurological: Negative for headaches.    Dilation: 1 Effacement (%): Thick Station: -3;-2 Exam by:: A.Davis,RN Blood pressure 141/87, pulse 82, temperature 98.8 F (37.1 C), temperature source Oral, resp. rate 18, height 5\' 7"  (1.702 m), weight 232 lb (105.235 kg). Maternal Exam:  Abdomen: Patient reports no abdominal tenderness. Introitus: Normal vulva. Cervix: Cervix evaluated by digital exam.     Physical Exam  Constitutional: She appears well-developed.   HENT:  Head: Normocephalic.  Neck: Neck supple. No thyromegaly present.  Cardiovascular: Normal rate and regular rhythm.   Respiratory: Breath sounds normal.  GI: Soft. Bowel sounds are normal.  Skin: No rash noted.    Prenatal labs: ABO, Rh: --/--/O POS, O POS (04/20 2030) Antibody: NEG (04/20 2030) Rubella: Immune (10/02 0000) RPR: NON REACTIVE (04/20 2025)  HBsAg: Negative (10/02 0000)  HIV: Non-reactive (01/14 0000)  GBS: POSITIVE (04/02 1052)   Assessment/Plan: Nullipara @ [redacted]w[redacted]d w/: Gestational HTN--B/P criteria for severe PIH IUGR GBS positive  Admit MgSO4 for seizure prophylaxis PCN for GBS prophylaxis   JACKSON-MOORE,Keniel Ralston A 11/18/2012, 8:38 AM

## 2012-11-18 NOTE — Progress Notes (Signed)
Stacy [name] is [name] 32 y.o. G1P0 at [redacted]w[redacted]d by LMP admitted for induction of labor due to Poor fetal growth.  Subjective: No complaints  Objective: BP 141/87  Pulse 82  Temp(Src) 98.8 F (37.1 C) (Oral)  Resp 18  Ht 5\' 7"  (1.702 m)  Wt 232 lb (105.235 kg)  BMI 36.33 kg/m2 I/O last 3 completed shifts: In: 1922.5 [P.O.:480; I.V.:1442.5] Out: 1800 [Urine:1800] Total I/O In: 225 [P.O.:100; I.V.:125] Out: 500 [Urine:500]  FHT:  FHR: 120 bpm, variability: moderate,  accelerations:  Present,  decelerations:  Present intermittent moderate variable decelerations UC:   irregular, every 5 minutes SVE:   Dilation: 1 Effacement (%): Thick Station: -3;-2 Exam by:: [name].Davis,RN  Labs: Lab Results  Component Value Date   WBC 8.1 11/17/2012   HGB 9.9* 11/17/2012   HCT 30.5* 11/17/2012   MCV 86.2 11/17/2012   PLT 339 11/17/2012    Assessment / Plan: Prodromal labor  Labor: Plan mechanical cervical ripening Preeclampsia:  on magnesium sulfate Fetal Wellbeing:  Category I Pain Control:  Labor support without medications I/D:  n/[name] Anticipated MOD:  NSVD  Stacy [name],Stacy [name] 11/18/2012, 8:15 AM

## 2012-11-18 NOTE — Progress Notes (Signed)
CTSP with heavy vaginal bleed.  S/P foley bulb placement.  FHT w/a moderate variable deceleration x 1.  SVE: cx 2/80/-2.  AROM for clear fluid.  A/P  Bleed from cervix w/ foley bulb placement FHT c/w fetal well-being Monitor closely

## 2012-11-18 NOTE — Progress Notes (Signed)
2956 charge Rn at bs helping place bag to catheter and 75-100cc frank bleeding noted in bag immediately.Sr jackson-moore called but Dr Clearance Coots sent to evaluate pt since he is closer. 0920 pt vomiting and diaphoretic and states she is hot and does not feel good. BP taken and OBRR RN at Bs to assist while waiting on Dr Clearance Coots. 2130 dr Clearance Coots at [facility] and vbl decel noted and CS called. 8657 Dr Tamela Oddi at bs. Dr Jean Rosenthal from anesthesia at [facility] to see pt. SVE by OB 2-3cm, AROM clear fluid and IUPC placed. Fluid in IUPC clear and no new bleeding noted. Pt feeling uc's.

## 2012-11-18 NOTE — Progress Notes (Signed)
0981 to desk to see if dr Tamela Oddi available to evaluate pt. She is in the hospital. Plan to apply foley bag to evalute bleeding as plug on catheter has been pushed out 2x by a clot.

## 2012-11-18 NOTE — Progress Notes (Signed)
Dr. Gaynell Face called, notified of pt uc pattern, FHR tracing with decels, orders to hold Cytotec and reassess in morning

## 2012-11-18 NOTE — Progress Notes (Signed)
0900, smlall to modrate bright red bleeding on pt's pad in bed. Pt up to bathroom per her request and pt unable to void but 50cc bright red blood noted in nuns cap. Pt given pericare and pad to assess for bleeding. Dr Tamela Oddi stated at 0840 she would return to check on pt and bleeding.

## 2012-11-18 NOTE — Progress Notes (Signed)
Lab at bs for CBC. 

## 2012-11-19 LAB — CBC
HCT: 26.1 % — ABNORMAL LOW (ref 36.0–46.0)
Platelets: 353 10*3/uL (ref 150–400)
RDW: 14.1 % (ref 11.5–15.5)
WBC: 18.6 10*3/uL — ABNORMAL HIGH (ref 4.0–10.5)

## 2012-11-19 MED ORDER — MAGNESIUM SULFATE 40 G IN LACTATED RINGERS - SIMPLE
2.0000 g/h | INTRAVENOUS | Status: AC
  Administered 2012-11-19: 2 g/h via INTRAVENOUS
  Filled 2012-11-19 (×2): qty 500

## 2012-11-19 NOTE — Lactation Note (Signed)
This note was copied from the chart of Stacy Elizabethanne Keesey. Lactation Consultation Note  Mom states baby feeds well from the left side but not the right. Discussed strategies to get baby to latch to the right, attempted football hold with hand expression but baby still did not latch to the right.  Attempted to latch baby cross cradle on the left, but baby still did not latch. Left baby STS with mom; enc mom to attempt again when baby starts showing hunger cues. Reviewed br feeding basics, and hand expression. Enc mom to call for help if she has any concerns. Questions answered.   Patient Name: Stacy [name] [id number] Date: 11/19/2012 Reason for consult: Follow-up assessment   Maternal Data    Feeding Feeding Type: Breast Milk Feeding method: Breast  LATCH Score/Interventions Latch: Too sleepy or reluctant, no latch achieved, no sucking elicited. Intervention(s): Skin to skin;Teach feeding cues;Waking techniques Intervention(s): Adjust position;Assist with latch;Breast massage;Breast compression  Audible Swallowing: None Intervention(s): Skin to skin;Hand expression  Type of Nipple: Everted at rest and after stimulation  Comfort (Breast/Nipple): Soft / non-tender     Hold (Positioning): No assistance needed to correctly position infant at breast. Intervention(s): Breastfeeding basics reviewed;Support Pillows;Position options;Skin to skin  LATCH Score: 6  Lactation Tools Discussed/Used     Consult Status Consult Status: Follow-up Follow-up type: In-patient    Octavio Manns [facility] 11/19/2012, 3:45 PM

## 2012-11-19 NOTE — Progress Notes (Signed)
Post Partum Day 1 Subjective: no complaints  Objective: Blood pressure 125/78, pulse 75, temperature 98 F (36.7 C), temperature source Oral, resp. rate 18, height 5\' 7"  (1.702 m), weight 227 lb (102.967 kg), SpO2 99.00%, unknown if currently breastfeeding.  Physical Exam:  General: alert and no distress Lochia: appropriate Uterine Fundus: firm Incision: healing well DVT Evaluation: No evidence of DVT seen on physical exam.   Recent Labs  11/18/12 1450 11/19/12 0540  HGB 10.0* 8.4*  HCT 31.6* 26.1*    Assessment/Plan: PIH.  Stable.  D/C magnesium sulfate.  Transfer to floor.   LOS: 2 days   Kashaun Bebo A 11/19/2012, 6:44 AM

## 2012-11-19 NOTE — Progress Notes (Signed)
UR chart review completed.  

## 2012-11-20 MED ORDER — IBUPROFEN 600 MG PO TABS: 600.0000 mg | ORAL_TABLET | Freq: Four times a day (QID) | ORAL | Status: DC | PRN

## 2012-11-20 MED ORDER — OXYCODONE-ACETAMINOPHEN 5-325 MG PO TABS: 1.0000 | ORAL_TABLET | ORAL | Status: DC | PRN

## 2012-11-20 NOTE — Discharge Summary (Signed)
Obstetric Discharge Summary Reason for Admission: onset of labor Prenatal Procedures: ultrasound Intrapartum Procedures: spontaneous vaginal delivery Postpartum Procedures: none Complications-Operative and Postpartum: none Hemoglobin  Date Value Range Status  11/19/2012 8.4* 12.0 - 15.0 g/dL Final  9/81/1914 78.2   Final     HCT  Date Value Range Status  11/19/2012 26.1* 36.0 - 46.0 % Final  08/13/2012 34   Final    Physical Exam:  General: alert and no distress Lochia: appropriate Uterine Fundus: firm Incision: healing well DVT Evaluation: No evidence of DVT seen on physical exam.  Discharge Diagnoses: Term Pregnancy-delivered  Discharge Information: Date: 11/20/2012 Activity: pelvic rest Diet: routine Medications: PNV, Ibuprofen, Colace and Percocet Condition: stable Instructions: refer to practice specific booklet Discharge to: home Follow-up Information   Follow up with Antionette Char A, MD. Schedule an appointment as soon as possible for [name] visit in 6 weeks.   Contact information:   [address] [place] [address] [phone]       Newborn Data: Live born female  Birth Weight: 6 lb 8.8 oz (2971 g) APGAR: 9, 9  Home with mother.  Stacy [name],Stacy [name] 11/20/2012, 6:40 AM

## 2012-11-21 ENCOUNTER — Ambulatory Visit (HOSPITAL_COMMUNITY)
Admission: RE | Admit: 2012-11-21 | Discharge: 2012-11-21 | Disposition: A | Discharge status: Home / Self Care | Payer: BC Managed Care – PPO | Source: Ambulatory Visit | Attending: Obstetrics | Admitting: Obstetrics

## 2012-11-21 LAB — TYPE AND SCREEN
ABO/RH(D): O POS
Antibody Screen: NEGATIVE
Unit division: 0
Unit division: 0

## 2012-11-21 NOTE — Progress Notes (Signed)
Pt discharged before CSW could assess reason for LPNC. CSW will monitor drug screen results & make a report if necessary.      

## 2012-11-21 NOTE — Lactation Note (Signed)
Adult Lactation Consultation Outpatient Visit Note  Patient Name: Stacy [name] Date of Birth: 02/12/1981 Gestational Age at Delivery: [redacted]w[redacted]d Type of Delivery: vaginal delivery - 11/18/2012 Birth weight - 6-8 oz                                                                           D/C weight - 6-1 oz                                                                  Per mom scheduled for weight check at [facility]                                                                            Today's weight - 5-12.9 oz 2634 g                           - Per mom has Magso4 tx for 24 hours after delivery , did not have to be tx with meds for D/C .  Breastfeeding History: per mom milk came in 2 am today , non challenges with engorgement  Frequency of Breastfeeding: every 3-4 hours  Length of Feeding: 10-15 mins  Voids: 4-5 wet , per mom increasing in heaviness - still orange noted  Stools: 4-5 mec - green   Supplementing / Method: Pumping:  Type of Pump:DEBP Medela - has not use it as of yet    Frequency:  Volume:    Comments: Discussed hand expressing or pumping off fullness if needed - Engorgement tx if needed.                                                Short frenulum noted - baby is able to stretch tongue over gum line, at this point doesn't interfere with obtaining depth                                                                                  The breast.  Consultation Evaluation: Per mom last feeding at 8am for 20 mins 1side  Noted both breast are full with small nodules, full not engorged. Right nipple appears healthy , left lateral positional strip , intact , no breakdown noted  . Milk in today at 2am per mom.   Initial Feeding Assessment:right breast / football  Pre-feed Weight:5-12.9 oz 2634g  Post-feed Weight:5-13.2 oz 2642g  Amount Transferred:8 ml  Comments: Mom needed assistance with latch and depth at the breast .  Baby sustained latch for 18 mins and released                      After weight , still showing feeding cues , re-latched on same breast , due to fullness and nodules still noted.                      After feeding voided - diaper changed and re-weight  Additional Feeding Assessment: Re-latched same breast  Pre-feed Weight: 2610 g  Post-feed Weight:5-12.9 oz 2634g  Amount Transferred:24 ml  Comments:                    2 greenish yellowish stools changed prior to latch  Additional Feeding Assessment: Left breast / football  Pre-feed Weight:5-12.4 oz 2620g  Post-feed Weight:5-12.6 oz 2624 g  Amount Transferred:53ml  Comments: worked on obtaining depth at the breast , Baby able to sustain latch with depth for 10 -12 mins and released.                                Baby was content until he was being dressed and started routing again .                     Mom re-latched baby with clothes on to top off  With all 3 latches showed and stressed the importance of depth at the breast and showed dad how he could help to mom to obtain it.  Total Breast milk Transferred this Visit: 36 ml  Total Supplement Given: none   Lactation Plan of Care -  Encouraged rest , naps ,plenty fluids and nutritious snacks and meals                                          - Feedings - skin to skin feedings until gaining well                                         - Every 2 1/2 - 3hours and with feeding cues                                         - Steps for latching - breast massage , hand express, prepump if needed                                         - Work on depth at the breast - look for a consistent swallowing pattern  Engorgement tx - refer Baby and Me booklet pg 15                            Lc offered F/U appointment and mom plans to call if needed and F/U with weight checks as stated below.  Follow-Up- Per mom F/U appointment with [facility] tomorrow 4/25 at 1145am                   -  Arrange for Smart start to weigh the baby Monday .                 - Breastfeeding support group Tuesday at 11 am       Kathrin Greathouse 11/21/2012, 1:27 PM

## 2012-11-22 ENCOUNTER — Inpatient Hospital Stay (HOSPITAL_COMMUNITY): Admission: AD | Admit: 2012-11-22 | Payer: Self-pay | Source: Ambulatory Visit | Admitting: Obstetrics

## 2012-11-22 ENCOUNTER — Ambulatory Visit (HOSPITAL_COMMUNITY): Payer: BC Managed Care – PPO

## 2012-12-02 ENCOUNTER — Ambulatory Visit (HOSPITAL_COMMUNITY): Payer: BC Managed Care – PPO

## 2012-12-03 ENCOUNTER — Ambulatory Visit (HOSPITAL_COMMUNITY)
Admission: RE | Admit: 2012-12-03 | Discharge: 2012-12-03 | Disposition: A | Discharge status: Home / Self Care | Payer: BC Managed Care – PPO | Source: Ambulatory Visit | Attending: Obstetrics & Gynecology | Admitting: Obstetrics & Gynecology

## 2012-12-03 NOTE — Lactation Note (Signed)
Adult Lactation Consultation Outpatient Visit Note  Patient Name: Stacy [name]                                                                                     '[name]" Date of Birth: Apr 26, 1981                               todays weight,7-.3,3466 Gestational Age at Delivery: [redacted]w[redacted]d Type of Delivery: vaginal del  Breastfeeding History: Frequency of Breastfeeding: mother has been unable to get [name] to sustain latch on breast since first week of life.  Length of Feeding:  Voids:10  Stools: 10 yellow  Supplementing / Method: bottle taking 90-138ml EBM every 2-3 hours Pumping:  Type of Pump:Medela Advance   Frequency:every 2-3 hours for 30 mins  Volume: 3 ounces   Comments:  Mother has been exclusively pumping for over one week. Her goal is to exclusively breast feed until she goes back to work in August.  Mother lives alone and has family support. Consultation Evaluation: Mother was fit with #24 nipple shield and an SNS was sat up with 60 ml of EBM, infant sustained latch with and took enitre 76 ml . Lots of teaching on proper support and latch. Mother taught breast compression. Initial Feeding Assessment: Pre-feed [id number] Post-feed [id number] Amount Transferred:80ml, with SNS, 16ml with curved tip syringe Comments:  Additional Feeding Assessment:infant latched to (R) breast in cross cradle hold and infant too 10 ml form SNS. #24 nipple shield fits well. Mother was given inst to apply nipple shield for proper fit. Pre-feed [id number] Post-feed [id number] Amount Transferred 10 ml with sns: Comments:bottle with slow flow nipple was given for remainder of feeding. 14 ml  Additional Feeding Assessment: Pre-feed Weight: Post-feed Weight: Amount Transferred: Comments:  Total Breast milk Transferred this Visit: no transfer from breast. Total Supplement Given: EBM with SNS  Additional Interventions: Mother was given written plan as follows: 1) mother ins to  continue to offer [name] breast using Nipple Shield. Encouraged mother to sat up SNS and have ready.Give at least 90-128ml of EBM with each feeding.Mother to feed often and early. 2)encouraged mother to be patient with [name] as he learns to breastfeed. inst her to use SNS as often as she can. Encourage to  Calm him if he gets over stimulated and try again next feeding if she becomes frustrated.,3) mother inst to continue to pump after each feeding for 30 mins.  And hand express several mins after each feeding. Mother to follow up in one week and recommend that she attend support group.  Follow-Up  May 13 at 1pm    Stevan Born [facility] 12/03/2012, 2:28 PM

## 2012-12-10 ENCOUNTER — Ambulatory Visit (HOSPITAL_COMMUNITY): Payer: BC Managed Care – PPO

## 2012-12-13 ENCOUNTER — Encounter (HOSPITAL_COMMUNITY): Payer: Self-pay | Admitting: *Deleted

## 2012-12-13 ENCOUNTER — Emergency Department (HOSPITAL_COMMUNITY)
Admission: EM | Admit: 2012-12-13 | Discharge: 2012-12-13 | Disposition: A | Discharge status: Home / Self Care | Payer: BC Managed Care – PPO | Attending: Emergency Medicine | Admitting: Emergency Medicine

## 2012-12-13 ENCOUNTER — Telehealth (HOSPITAL_COMMUNITY): Payer: Self-pay | Admitting: Licensed Clinical Social Worker

## 2012-12-13 DIAGNOSIS — O99345 Other mental disorders complicating the puerperium: Secondary | ICD-10-CM | POA: Insufficient documentation

## 2012-12-13 DIAGNOSIS — Z79899 Other long term (current) drug therapy: Secondary | ICD-10-CM | POA: Insufficient documentation

## 2012-12-13 NOTE — BHH Counselor (Signed)
Received a call from PA, Lorelle Formosa asking this Clinical research associate to provide patient with outpatient referrals. Patient was referred here to [facility] by her PCP due to depressive symptoms reported by patient's family. Patient is apparently a new mother x1 month with what sounds like postpartum depression. She does not have a history of depression prior to giving birth. She denies SI, HI, and AVH's. After meeting with patient she was give a list of local therapist, psychiatrist, support groups for post partum depression, and the information to mobile crises. Patient agreed to follow up with the referrals given.

## 2012-12-13 NOTE — ED Notes (Signed)
Pt states was recommended by her OB/GYN to come here and talk to the act team, pt is having post-partum depression, had someone call her doctor b/c they were concerned about pt, doctor called pt and voiced she needed to come here, pt states since she had the baby she had thoughts of wanting to just drop her baby off and leave, states she does not have SI or HI, just doesn't want baby. States never been diagnosed with depression or anxiety but has been depressed in past.

## 2012-12-13 NOTE — ED Provider Notes (Signed)
Medical screening examination/treatment/procedure(s) were performed by non-physician practitioner and as supervising physician I was immediately available for consultation/collaboration.  Chelsey Redondo, MD 12/13/12 2259 

## 2012-12-13 NOTE — ED Provider Notes (Signed)
History    This chart was scribed for Junious Silk, PA working with Geoffery Lyons, MD by ED Scribe, Burman Nieves. This patient was seen in room WTR1/WLPT1 and the patient's care was started at 5:09 PM.   CSN: [id number]  Arrival date & time 12/13/12  1701   First MD Initiated Contact with Patient 12/13/12 1709      Chief Complaint  Patient presents with  . Medical Clearance    (Consider location/radiation/quality/duration/timing/severity/associated sxs/prior treatment) The history is provided by the patient. No language interpreter was used.   HPI Comments: Stacy [name] is a 32 y.o. female who was recommended by her OB/GYN who presents to the Emergency Department complaining of post-partum depression which she has had in the past. Pt is currently calm and alert in the ED. Pt's OB/GYN recommended to come to the ED today to talk to an ACT team. Pt states that she has thoughts of just dropping her baby off and leaving her. She states that she does not want her baby. She denies SI, HI, or any hallucinations. Pt states that she has never been diagnosed with depression or anxiety. Pt denies fever, chills, cough, nausea, vomiting, diarrhea, SOB, weakness, and any other associated symptoms.   Past Medical History  Diagnosis Date  . No pertinent past medical history   . Medical history non-contributory     Past Surgical History  Procedure Laterality Date  . Wisdom tooth extraction      Family History  Problem Relation Age of Onset  . Cancer Maternal Grandmother     lung    History  Substance Use Topics  . Smoking status: Never Smoker   . Smokeless tobacco: Never Used  . Alcohol Use: No    OB History   Grav Para Term Preterm Abortions TAB SAB Ect Mult Living   1 1 1       1       Review of Systems  Psychiatric/Behavioral: The patient is nervous/anxious.   All other systems reviewed and are negative.    Allergies  Review of patient's allergies indicates no known  allergies.  Home Medications   Current Outpatient Rx  Name  Route  Sig  Dispense  Refill  . ibuprofen (ADVIL,MOTRIN) 600 MG tablet   Oral   Take 1 tablet (600 mg total) by mouth every 6 (six) hours as needed for pain.   30 tablet   5   . omeprazole (PRILOSEC) 20 MG capsule   Oral   Take 1 capsule by mouth daily.         Marland Kitchen oxyCODONE-acetaminophen (PERCOCET/ROXICET) 5-325 MG per tablet   Oral   Take 1-2 tablets by mouth every 4 (four) hours as needed for pain.   40 tablet   0   . prenatal vitamin w/FE, FA (PRENATAL 1 + 1) 27-1 MG TABS   Oral   Take 1 tablet by mouth daily at 12 noon.           BP 137/87  Pulse 82  Temp(Src) 97.7 F (36.5 C) (Oral)  Resp 18  Ht 5\' 6"  (1.676 m)  Wt 211 lb (95.709 kg)  BMI 34.07 kg/m2  SpO2 99%  Breastfeeding? Yes  Physical Exam  Nursing note and vitals reviewed. Constitutional: She is oriented to person, place, and time. She appears well-developed and well-nourished. No distress.  HENT:  Head: Normocephalic and atraumatic.  Right Ear: External ear normal.  Left Ear: External ear normal.  Nose: Nose normal.  Mouth/Throat:  Oropharynx is clear and moist.  Eyes: Conjunctivae are normal.  Neck: Normal range of motion.  Cardiovascular: Normal rate, regular rhythm and normal heart sounds.   Pulmonary/Chest: Effort normal and breath sounds normal. No stridor. No respiratory distress. She has no wheezes. She has no rales.  Abdominal: Soft. She exhibits no distension.  Musculoskeletal: Normal range of motion.  Neurological: She is alert and oriented to person, place, and time. She has normal strength.  Skin: Skin is warm and dry. She is not diaphoretic. No erythema.  Psychiatric: She has a normal mood and affect. Her behavior is normal.    ED Course  Procedures (including critical care time) DIAGNOSTIC STUDIES: Oxygen Saturation is 99% on room air, normal by my interpretation.    COORDINATION OF CARE: 5:23 PM Discussed ED  treatment with pt and pt agrees.     Labs Reviewed - No data to display No results found.   1. Post partum depression       MDM  Patient was sent here by her OB/GYN office for evaluation of post partum depression and evaluation by ACT team. Normal PE. Feelings of wanting to leave her baby with a family member. ACT team evaluated patient and gave her outpatient resources for psychiatry and help lines. No SI/HI. Patient has a support system at home and will follow up.      I personally performed the services described in this documentation, which was scribed in my presence. The recorded information has been reviewed and is accurate.    Mora Bellman, PA-C 12/13/12 2257

## 2013-01-01 ENCOUNTER — Encounter: Payer: Self-pay | Admitting: Obstetrics & Gynecology

## 2013-01-01 ENCOUNTER — Ambulatory Visit (INDEPENDENT_AMBULATORY_CARE_PROVIDER_SITE_OTHER): Payer: BC Managed Care – PPO | Admitting: Obstetrics & Gynecology

## 2013-01-01 VITALS — BP 117/82 | HR 73 | Temp 98.4°F | Wt 208.0 lb

## 2013-01-01 DIAGNOSIS — F329 Major depressive disorder, single episode, unspecified: Secondary | ICD-10-CM

## 2013-01-01 DIAGNOSIS — F53 Postpartum depression: Secondary | ICD-10-CM | POA: Insufficient documentation

## 2013-01-01 DIAGNOSIS — O99345 Other mental disorders complicating the puerperium: Secondary | ICD-10-CM

## 2013-01-01 DIAGNOSIS — Z309 Encounter for contraceptive management, unspecified: Secondary | ICD-10-CM

## 2013-01-01 MED ORDER — SERTRALINE HCL 25 MG PO TABS: 25.0000 mg | ORAL_TABLET | Freq: Every day | ORAL | Status: DC

## 2013-01-01 MED ORDER — SERTRALINE HCL 50 MG PO TABS: 50.0000 mg | ORAL_TABLET | Freq: Every day | ORAL | Status: DC

## 2013-01-01 NOTE — Patient Instructions (Signed)

## 2013-01-01 NOTE — Progress Notes (Signed)
Subjective:     Stacy [name] is a 32 y.o. female who presents for a postpartum visit. She is 6 weeks postpartum following a spontaneous vaginal delivery. I have fully reviewed the prenatal and intrapartum course. The delivery was at 39 gestational weeks. Outcome: spontaneous vaginal delivery. Anesthesia: none. Postpartum course has been normal. Baby's course has been normal. Baby is feeding by breast. Bleeding no bleeding. Bowel function is abnormal: constipation. Bladder function is normal. Patient is not sexually active. Contraception method is abstinence. Postpartum depression screening: positive.  The following portions of the patient's history were reviewed and updated as appropriate: allergies, current medications, past family history, past medical history, past social history, past surgical history and problem list.  Review of Systems Pertinent items are noted in HPI.   Objective:    BP 117/82  Pulse 73  Temp(Src) 98.4 F (36.9 C) (Oral)  Wt 208 lb (94.348 kg)  BMI 33.59 kg/m2  Breastfeeding? Yes        General:  alert     Abdomen: soft, non-tender; bowel sounds normal; no masses,  no organomegaly   Vulva:  normal  Vagina: normal vagina  Cervix:  no lesions  Corpus: normal size, contour, position, consistency, mobility, non-tender  Adnexa:  normal adnexa   Assessment:   Postpartum depression  Plan:    1. Contraception: abstinence 2. Start Zoloft; recommend f/u w/therapist 3. Follow up in: 2 weeks.

## 2013-01-02 LAB — VITAMIN D 25 HYDROXY (VIT D DEFICIENCY, FRACTURES): Vit D, 25-Hydroxy: 26 ng/mL — ABNORMAL LOW (ref 30–89)

## 2013-01-15 ENCOUNTER — Encounter: Payer: Self-pay | Admitting: Obstetrics & Gynecology

## 2013-01-15 ENCOUNTER — Ambulatory Visit (INDEPENDENT_AMBULATORY_CARE_PROVIDER_SITE_OTHER): Payer: BC Managed Care – PPO | Admitting: Obstetrics & Gynecology

## 2013-01-15 VITALS — BP 133/87 | HR 62 | Temp 98.3°F | Ht 67.0 in | Wt 200.8 lb

## 2013-01-15 DIAGNOSIS — O99345 Other mental disorders complicating the puerperium: Secondary | ICD-10-CM

## 2013-01-15 DIAGNOSIS — F53 Postpartum depression: Secondary | ICD-10-CM

## 2013-01-15 DIAGNOSIS — F329 Major depressive disorder, single episode, unspecified: Secondary | ICD-10-CM

## 2013-01-15 NOTE — Progress Notes (Signed)
.   Subjective:     Stacy [name] is a 32 y.o. female who presents for a postpartum visit. She is 8 weeks postpartum following a spontaneous vaginal delivery. I have fully reviewed the prenatal and intrapartum course. The delivery was at 39 gestational weeks. Outcome: spontaneous vaginal delivery. Anesthesia: none. Postpartum course has been ok. Baby's course has been normal. Baby is feeding by breast. Bleeding no bleeding. Bowel function is normal. Bladder function is normal. Patient is not sexually active. Contraception method is none. Postpartum depression screening: positive.  Patient states that she is feeling a little better after taking the Zoloft, "she is not crying as much".  The following portions of the patient's history were reviewed and updated as appropriate: allergies, current medications, past family history, past medical history, past social history, past surgical history and problem list.  Review of Systems Pertinent items are noted in HPI.   Objective:    BP 133/87  Pulse 62  Temp(Src) 98.3 F (36.8 C) (Oral)  Ht 5\' 7"  (1.702 m)  Wt 200 lb 12.8 oz (91.082 kg)  BMI 31.44 kg/m2  Breastfeeding? Yes        Assessment:   Postpartum depression--mild Improved score on depression screening tool  Plan:   Continue SSRI for a total of 4 - 9 mths Return in a month

## 2013-01-15 NOTE — Patient Instructions (Addendum)

## 2013-02-13 ENCOUNTER — Ambulatory Visit (INDEPENDENT_AMBULATORY_CARE_PROVIDER_SITE_OTHER): Payer: BC Managed Care – PPO | Admitting: Obstetrics & Gynecology

## 2013-02-13 ENCOUNTER — Encounter: Payer: Self-pay | Admitting: Obstetrics & Gynecology

## 2013-02-13 VITALS — BP 138/92 | HR 62 | Temp 98.6°F | Ht 66.0 in | Wt 205.0 lb

## 2013-02-13 DIAGNOSIS — F329 Major depressive disorder, single episode, unspecified: Secondary | ICD-10-CM

## 2013-02-13 DIAGNOSIS — O99345 Other mental disorders complicating the puerperium: Secondary | ICD-10-CM

## 2013-02-13 DIAGNOSIS — F53 Postpartum depression: Secondary | ICD-10-CM

## 2013-02-13 NOTE — Progress Notes (Signed)
  Subjective:   Stacy [name] is an 32 y.o. female who presents for evaluation and treatment of depressive symptoms.  Onset approximately several months ago, gradually improving since that time.  Current symptoms include see questionnaire.  Current treatment for depression:Medication   Overall Mood: Greatly improved  Hopelessness: Absent Suicidal ideation: Absent  Other/Psychosocial Stressors: none .  Review of Systems Pertinent items are noted in HPI.   Objective:   Mental Status Examination: Posture and motor behavior: Negative Dress, grooming, personal hygiene: Appropriate Facial expression: Appropriate Speech: Appropriate Mood: Appropriate Coherency and relevance of thought: Appropriate Thought content: Appropriate Perceptions: Appropriate Orientation:Appropriate Attention and concentration: Appropriate Memory: : Not examined Information: Not done Vocabulary: Appropriate Abstract reasoning: Not examined Judgment: Not examined    Assessment:   Experiencing the following symptoms of depression most of the day nearly every day for more than two consecutive weeks: loss of interests/pleasure  Depressive Disorder:Mild depressive episode  Suicide Risk Assessment: None  Suicidal intent: None Suicidal plan: None *   Plan:   Continue SSRI to complete a 4 - 9 month course Return in a few months  Reviewed concept of depression as biochemical imbalance of neurotransmitters and rationale for treatment. Instructed patient to contact office or on-call physician promptly should condition worsen or any new symptoms appear and provided on-call telephone numbers.

## 2013-02-17 ENCOUNTER — Encounter: Payer: Self-pay | Admitting: Obstetrics & Gynecology

## 2013-02-20 ENCOUNTER — Ambulatory Visit: Payer: BC Managed Care – PPO | Admitting: Obstetrics & Gynecology

## 2013-04-16 ENCOUNTER — Ambulatory Visit: Payer: BC Managed Care – PPO | Admitting: Obstetrics & Gynecology

## 2013-04-24 ENCOUNTER — Encounter: Payer: Self-pay | Admitting: Obstetrics & Gynecology

## 2013-05-26 ENCOUNTER — Ambulatory Visit: Payer: BC Managed Care – PPO | Admitting: Obstetrics & Gynecology

## 2014-06-01 ENCOUNTER — Encounter: Payer: Self-pay | Admitting: Obstetrics & Gynecology

## 2014-07-28 ENCOUNTER — Encounter: Payer: Self-pay | Admitting: Obstetrics & Gynecology

## 2014-08-13 ENCOUNTER — Encounter (HOSPITAL_COMMUNITY): Payer: Self-pay | Admitting: Obstetrics & Gynecology

## 2014-09-20 ENCOUNTER — Ambulatory Visit (INDEPENDENT_AMBULATORY_CARE_PROVIDER_SITE_OTHER): Payer: BC Managed Care – PPO | Admitting: Emergency Medicine

## 2014-09-20 VITALS — BP 112/68 | HR 84 | Temp 98.2°F | Resp 16 | Ht 66.25 in | Wt 244.6 lb

## 2014-09-20 DIAGNOSIS — G5601 Carpal tunnel syndrome, right upper limb: Secondary | ICD-10-CM

## 2014-09-20 MED ORDER — NAPROXEN SODIUM 550 MG PO TABS
550.0000 mg | ORAL_TABLET | Freq: Two times a day (BID) | ORAL | Status: AC
Start: 2014-09-20 — End: 2015-09-20

## 2014-09-20 MED ORDER — NIACIN 100 MG PO TABS: 100.0000 mg | ORAL_TABLET | Freq: Three times a day (TID) | ORAL | Status: DC

## 2014-09-20 NOTE — Progress Notes (Signed)
[facility] [address] [place] [address] [phone]  Date:  09/20/2014   Name:  Stacy [name]   DOB:  Aug 26, 1980   MRN:  [id number]  PCP:  No PCP Per Patient    Chief Complaint: Hand Pain   History of Present Illness:  Stacy [name] is a 34 y.o. 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 She has no weakness No history of injury. Worse with typing. Better at rest. No improvement with over the counter medications or other home remedies.  Denies other complaint or health concern today.   Patient Active Problem List   Diagnosis Date Noted  . Postpartum depression 01/01/2013    Past Medical History  Diagnosis Date  . No pertinent past medical history   . Medical history non-contributory     Past Surgical History  Procedure Laterality Date  . Wisdom tooth extraction      History  Substance Use Topics  . Smoking status: Never Smoker   . Smokeless tobacco: Never Used  . Alcohol Use: 0.6 oz/week    1 Glasses of wine per week    Family History  Problem Relation Age of Onset  . Cancer Maternal Grandmother     lung    No Known Allergies  Medication list has been reviewed and updated.  Current Outpatient Prescriptions on File Prior to Visit  Medication Sig Dispense Refill  . ibuprofen (ADVIL,MOTRIN) 600 MG tablet Take 1 tablet (600 mg total) by mouth every 6 (six) hours as needed for pain. 30 tablet 5  . prenatal vitamin w/FE, FA (PRENATAL 1 + 1) 27-1 MG TABS Take 1 tablet by mouth daily at 12 noon.    . sertraline (ZOLOFT) 25 MG tablet Take 1 tablet (25 mg total) by mouth daily. 7 tablet 0  . sertraline (ZOLOFT) 50 MG tablet Take 1 tablet (50 mg total) by mouth daily. (Patient not taking: Reported on 09/20/2014)  30 tablet 5   No current facility-administered medications on file prior to visit.    Review of Systems:  As per HPI, otherwise negative.    Physical Examination: Filed Vitals:   09/20/14 1337  BP: 112/68  Pulse: 84  Temp: 98.2 F (36.8 C)  Resp: 16   Filed Vitals:   09/20/14 1337  Height: 5' 6.25" (1.683 m)  Weight: 244 lb 9.6 oz (110.95 kg)   Body mass index is 39.17 kg/(m^2). Ideal Body Weight: Weight in (lb) to have BMI = 25: 155.7  GEN: WDWN, NAD, Non-toxic, A & O x 3 HEENT: Atraumatic, Normocephalic. Neck supple. No masses, No LAD. Ears and Nose: No external deformity. CV: RRR, No M/G/R. No JVD. No thrill. No extra heart sounds. PULM: CTA B, no wheezes, crackles, rhonchi. No retractions. No resp. distress. No accessory muscle use. ABD: S, NT, ND, +BS. No rebound. No HSM. EXTR: No c/c/e NEURO Normal gait.  PSYCH: Normally interactive. Conversant. Not depressed or anxious appearing.  Calm demeanor.  RIGHT hand:  phalen positive.  No thenar wasting  Assessment and Plan: Carpal tunnel splint Continue splinting Anaprox Follow up in one month   Signed,  Ellison Carwin, MD

## 2014-09-20 NOTE — Patient Instructions (Signed)
carCarpal Tunnel Syndrome The carpal tunnel is a narrow area located on the palm side of your wrist. The tunnel is formed by the wrist bones and ligaments. Nerves, blood vessels, and tendons pass through the carpal tunnel. Repeated wrist motion or certain diseases may cause swelling within the tunnel. This swelling pinches the main nerve in the wrist (median nerve) and causes the painful hand and arm condition called carpal tunnel syndrome. CAUSES   Repeated wrist motions.  Wrist injuries.  Certain diseases like arthritis, diabetes, alcoholism, hyperthyroidism, and kidney failure.  Obesity.  Pregnancy. SYMPTOMS   A "pins and needles" feeling in your fingers or hand, especially in your thumb, index and middle fingers.  Tingling or numbness in your fingers or hand.  An aching feeling in your entire arm, especially when your wrist and elbow are bent for long periods of time.  Wrist pain that goes up your arm to your shoulder.  Pain that goes down into your palm or fingers.  A weak feeling in your hands. DIAGNOSIS  Your health care provider will take your history and perform a physical exam. An electromyography test may be needed. This test measures electrical signals sent out by your nerves into the muscles. The electrical signals are usually slowed by carpal tunnel syndrome. You may also need X-rays. TREATMENT  Carpal tunnel syndrome may clear up by itself. Your health care provider may recommend a wrist splint or medicine such as a nonsteroidal anti-inflammatory medicine. Cortisone injections may help. Sometimes, surgery may be needed to free the pinched nerve.  HOME CARE INSTRUCTIONS   Take all medicine as directed by your health care provider. Only take over-the-counter or prescription medicines for pain, discomfort, or fever as directed by your health care provider.  If you were given a splint to keep your wrist from bending, wear it as directed. It is important to wear the splint  at night. Wear the splint for as long as you have pain or numbness in your hand, arm, or wrist. This may take 1 to 2 months.  Rest your wrist from any activity that may be causing your pain. If your symptoms are work-related, you may need to talk to your employer about changing to a job that does not require using your wrist.  Put ice on your wrist after long periods of wrist activity.  Put ice in a plastic bag.  Place a towel between your skin and the bag.  Leave the ice on for 15-20 minutes, 03-04 times a day.  Keep all follow-up visits as directed by your health care provider. This includes any orthopedic referrals, physical therapy, and rehabilitation. Any delay in getting necessary care could result in a delay or failure of your condition to heal. SEEK IMMEDIATE MEDICAL CARE IF:   You have new, unexplained symptoms.  Your symptoms get worse and are not helped or controlled with medicines. MAKE SURE YOU:   Understand these instructions.  Will watch your condition.  Will get help right away if you are not doing well or get worse. Document Released: 07/14/2000 Document Revised: 12/01/2013 Document Reviewed: 06/02/2011 [facility] Patient Information 2015 [place], [place]. This information is not intended to replace advice given to you by your health care provider. Make sure you discuss any questions you have with your health care provider.

## 2014-10-01 ENCOUNTER — Ambulatory Visit (INDEPENDENT_AMBULATORY_CARE_PROVIDER_SITE_OTHER): Payer: BC Managed Care – PPO | Admitting: Certified Nurse Midwife

## 2014-10-01 ENCOUNTER — Encounter: Payer: Self-pay | Admitting: Certified Nurse Midwife

## 2014-10-01 VITALS — BP 126/84 | HR 81 | Temp 97.7°F | Ht 67.0 in | Wt 245.0 lb

## 2014-10-01 DIAGNOSIS — L918 Other hypertrophic disorders of the skin: Secondary | ICD-10-CM

## 2014-10-01 DIAGNOSIS — Z30011 Encounter for initial prescription of contraceptive pills: Secondary | ICD-10-CM | POA: Diagnosis not present

## 2014-10-01 DIAGNOSIS — Z01419 Encounter for gynecological examination (general) (routine) without abnormal findings: Secondary | ICD-10-CM

## 2014-10-01 DIAGNOSIS — E669 Obesity, unspecified: Secondary | ICD-10-CM | POA: Diagnosis not present

## 2014-10-01 MED ORDER — LEVONORGEST-ETH ESTRAD 91-DAY 0.15-0.03 MG PO TABS: 1.0000 | ORAL_TABLET | Freq: Every day | ORAL | Status: DC

## 2014-10-01 NOTE — Progress Notes (Signed)
Patient ID: Stacy [name], female   DOB: 09/04/1980, 34 y.o.   MRN: [id number]    Subjective:     Stacy [name] is a 34 y.o. female here for a routine exam.  Current complaints: gaining weight, not using any form of birth control regularly, skin tags on right breast.    Personal health questionnaire:  Is patient Ashkenazi Jewish, have a family history of breast and/or ovarian cancer: no Is there a family history of uterine cancer diagnosed at age < 70, gastrointestinal cancer, urinary tract cancer, family member who is a Field seismologist syndrome-associated carrier: no Is the patient overweight and hypertensive, family history of diabetes, personal history of gestational diabetes, preeclampsia or PCOS: yes Is patient over 55, have PCOS,  family history of premature CHD under age 21, diabetes, smoke, have hypertension or peripheral artery disease:  no At any time, has a partner hit, kicked or otherwise hurt or frightened you?: no Over the past 2 weeks, have you felt down, depressed or hopeless?: no Over the past 2 weeks, have you felt little interest or pleasure in doing things?:no   Gynecologic History Patient's last menstrual period was 09/09/2014. Contraception: condoms and on occasion, not with every sexual encounter. Last Pap: unknown. Results were: unknown Last mammogram: none  Obstetric History OB History  Gravida Para Term Preterm AB SAB TAB Ectopic Multiple Living  1 1 1       1     # Outcome Date GA Lbr Len/2nd Weight Sex Delivery Anes PTL Lv  1 Term 11/18/12 [redacted]w[redacted]d / 00:05  M Vag-Spont   Y     Comments: WNL       Past Medical History  Diagnosis Date  . No pertinent past medical history   . Medical history non-contributory     Past Surgical History  Procedure Laterality Date  . Wisdom tooth extraction       Current outpatient prescriptions:  .  ibuprofen (ADVIL,MOTRIN) 600 MG tablet, Take 1 tablet (600 mg total) by mouth every 6 (six) hours as needed for pain., Disp:  30 tablet, Rfl: 5 .  levonorgestrel-ethinyl estradiol (SEASONALE) 0.15-0.03 MG tablet, Take 1 tablet by mouth daily., Disp: 1 Package, Rfl: 11 .  naproxen sodium (ANAPROX DS) 550 MG tablet, Take 1 tablet (550 mg total) by mouth 2 (two) times daily with a meal. (Patient not taking: Reported on 10/01/2014), Disp: 40 tablet, Rfl: 0 .  niacin 100 MG tablet, Take 1 tablet (100 mg total) by mouth 3 (three) times daily with meals. (Patient not taking: Reported on 10/01/2014), Disp: 100 tablet, Rfl: 1 .  sertraline (ZOLOFT) 50 MG tablet, Take 1 tablet (50 mg total) by mouth daily. (Patient not taking: Reported on 09/20/2014), Disp: 30 tablet, Rfl: 5 No Known Allergies  History  Substance Use Topics  . Smoking status: Never Smoker   . Smokeless tobacco: Never Used  . Alcohol Use: 1.2 oz/week    2 Glasses of wine per week    Family History  Problem Relation Age of Onset  . Cancer Maternal Grandmother     lung      Review of Systems  Constitutional: negative for fatigue and weight loss Respiratory: negative for cough and wheezing Cardiovascular: negative for chest pain, fatigue and palpitations Gastrointestinal: negative for abdominal pain and change in bowel habits Musculoskeletal:negative for myalgias Neurological: negative for gait problems and tremors Behavioral/Psych: negative for abusive relationship, depression Endocrine: negative for temperature intolerance   Genitourinary:negative for abnormal menstrual periods, genital lesions,  hot flashes, sexual problems and vaginal discharge Integument/breast: negative for breast lump, breast tenderness, nipple discharge and skin lesion(s)    Objective:       BP 126/84 mmHg  Pulse 81  Temp(Src) 97.7 F (36.5 C)  Ht 5\' 7"  (1.702 m)  Wt 111.131 kg (245 lb)  BMI 38.36 kg/m2  LMP 09/09/2014  Breastfeeding? No General:   alert  Skin:   no rash or abnormalities  Lungs:   clear to auscultation bilaterally  Heart:   regular rate and rhythm, S1,  S2 normal, no murmur, click, rub or gallop  Breasts:   normal without suspicious masses, skin or nipple changes or axillary nodes  Abdomen:  normal findings: no organomegaly, soft, non-tender and no hernia  Pelvis:  External genitalia: normal general appearance Urinary system: urethral meatus normal and bladder without fullness, nontender Vaginal: normal without tenderness, induration or masses Cervix: normal appearance Adnexa: normal bimanual exam Uterus: anteverted and non-tender, normal size   Lab Review Urine pregnancy test Labs reviewed yes Radiologic studies reviewed none   Assessment:    Healthy female exam.   Obesity Skin tags on right breast   Plan:    Education reviewed: low fat, low cholesterol diet, safe sex/STD prevention, self breast exams, skin cancer screening and weight bearing exercise. Contraception: OCP (estrogen/progesterone). Follow up in: 4 months.   Meds ordered this encounter  Medications  . levonorgestrel-ethinyl estradiol (SEASONALE) 0.15-0.03 MG tablet    Sig: Take 1 tablet by mouth daily.    Dispense:  1 Package    Refill:  11   Orders Placed This Encounter  Procedures  . SureSwab, Vaginosis/Vaginitis Plus  . Referral to Nutrition and Diabetes Services    Referral Priority:  Routine    Referral Type:  Consultation    Referral Reason:  Specialty Services Required    Number of Visits Requested:  1  . Ambulatory referral to Dermatology    Referral Priority:  Routine    Referral Type:  Consultation    Referral Reason:  Specialty Services Required    Requested Specialty:  Dermatology    Number of Visits Requested:  1   Need to obtain previous records

## 2014-10-02 ENCOUNTER — Telehealth: Payer: Self-pay

## 2014-10-02 NOTE — Telephone Encounter (Signed)
patient has appt with [facility] at [place], 10/16/14 with Arlyss Gandy let patient know and also that NDM would be calling her for appt as well directly

## 2014-10-03 LAB — PAP IG AND HPV HIGH-RISK: HPV DNA High Risk: NOT DETECTED

## 2014-10-06 LAB — SURESWAB, VAGINOSIS/VAGINITIS PLUS
Atopobium vaginae: 4.8 Log (cells/mL)
BV CATEGORY: UNDETERMINED — AB
C. GLABRATA, DNA: NOT DETECTED
C. TRACHOMATIS RNA, TMA: NOT DETECTED
C. albicans, DNA: NOT DETECTED
C. parapsilosis, DNA: NOT DETECTED
C. tropicalis, DNA: NOT DETECTED
GARDNERELLA VAGINALIS: 7 Log (cells/mL)
LACTOBACILLUS SPECIES: 7.8 Log (cells/mL)
MEGASPHAERA SPECIES: NOT DETECTED Log (cells/mL)
N. GONORRHOEAE RNA, TMA: NOT DETECTED
T. vaginalis RNA, QL TMA: NOT DETECTED

## 2014-10-09 ENCOUNTER — Other Ambulatory Visit: Payer: Self-pay | Admitting: *Deleted

## 2014-10-09 DIAGNOSIS — B9689 Other specified bacterial agents as the cause of diseases classified elsewhere: Secondary | ICD-10-CM

## 2014-10-09 DIAGNOSIS — N760 Acute vaginitis: Principal | ICD-10-CM

## 2014-10-09 MED ORDER — METRONIDAZOLE 500 MG PO TABS: 500.0000 mg | ORAL_TABLET | Freq: Two times a day (BID) | ORAL | Status: DC

## 2014-11-26 ENCOUNTER — Ambulatory Visit: Payer: BC Managed Care – PPO | Admitting: Dietician

## 2014-12-29 ENCOUNTER — Ambulatory Visit: Payer: BC Managed Care – PPO | Admitting: Dietician

## 2015-01-07 ENCOUNTER — Encounter (HOSPITAL_COMMUNITY): Payer: Self-pay | Admitting: Obstetrics & Gynecology

## 2015-02-03 ENCOUNTER — Encounter: Payer: Self-pay | Admitting: Skilled Nursing Facility1

## 2015-02-03 ENCOUNTER — Encounter: Payer: BC Managed Care – PPO | Attending: Certified Nurse Midwife | Admitting: Skilled Nursing Facility1

## 2015-02-03 VITALS — Ht 67.0 in | Wt 248.0 lb

## 2015-02-03 DIAGNOSIS — Z713 Dietary counseling and surveillance: Secondary | ICD-10-CM | POA: Insufficient documentation

## 2015-02-03 DIAGNOSIS — Z6838 Body mass index (BMI) 38.0-38.9, adult: Secondary | ICD-10-CM | POA: Insufficient documentation

## 2015-02-03 DIAGNOSIS — E669 Obesity, unspecified: Secondary | ICD-10-CM | POA: Diagnosis not present

## 2015-02-03 NOTE — Patient Instructions (Signed)
-  Take 20 minutes to eat and then wait 20 minutes after you eat to determine your hunger/fullness -Be aware of and in tune with your body -3 meals a day and 2-3 snacks with vegetables, lean meat, and carbohydrates -when you are about to eat (not due to hunger) find an activity: reading, shopping, movies, and insert new hobby here -Try to use your gym membership at least 3 days a week and then talk your son for a walk on the other days -Try to cut back on juice and soda start out with 1 glass of juice a day -Try to cut your outside of the home meal to once a week -Try to make your biscuits at home

## 2015-02-03 NOTE — Progress Notes (Signed)
  Medical Nutrition Therapy:  Appt start time: 1400 end time:  1500.   Assessment:  Primary concerns today: referred for obesity. Pt states her physician informed her she had gained wt since giving birth to her son (April 2014) breast fed for one year. Diet hx: before pregnancy very active and some good diet habits but then gained lost wt back due to working at night; after pt stopped breast feeding she gained wt (50 pound gain). Pts usual wt before working out 235-230 pounds but then below 200 when she started working out. Pts sleep: through the night for about 7 hours. Pt states she eats in her room or the living room by herself while her son eats at the table.   Pt states she has a gym membership but she does not sue it.  Preferred Learning Style:  No preference indicated   Learning Readiness:  Contemplating   MEDICATIONS: none   DIETARY INTAKE:  Usual eating pattern includes 2 meals and 3 snacks per day.  Everyday foods include none stated.  Avoided foods include none stated.    24-hr recall:  B ( AM): none-----fast food  Snk ( AM): none L ( PM): leftovers Snk ( PM): crackers, popcorn, anything D ( PM): fast food Snk ( PM): anything-chips, cookies Beverages: juice, water, soda  Usual physical activity: zumba once a week  Estimated energy needs: 1800 calories 200 g carbohydrates 135 g protein 50 g fat  Progress Towards Goal(s):  In progress.    Nutritional Diagnosis:  NB-1.1 Food and nutrition-related knowledge deficit As related to no prior nutrtion education from a nutrition professional.  As evidenced by BMI 38.84, pt report, and overconsumption of calorically dense snacks..    Intervention:  Nutrition counseling for obesity. Dietitian educated the pt on  Balanced/varied plate, eating at the table with her son, decreasing caloric beverages, and daily physical activity.  Goals: -Take 20 minutes to eat and then wait 20 minutes after you eat to determine your  hunger/fullness -Be aware of and in tune with your body -3 meals a day and 2-3 snacks with vegetables, lean meat, and carbohydrates -when you are about to eat (not due to hunger) find an activity: reading, shopping, movies, and insert new hobby here -Try to use your gym membership at least 3 days a week and then talk your son for a walk on the other days -Try to cut back on juice and soda start out with 1 glass of juice a day -Try to cut your outside of the home meal to once a week -Try to make your biscuits at home  Teaching Method Utilized:  Visual Auditory  Handouts given during visit include:  MyPlate  Snack handout  Barriers to learning/adherence to lifestyle change: mental fortitude to change  Demonstrated degree of understanding via:  Teach Back   Monitoring/Evaluation:  Dietary intake, exercise, and body weight prn.

## 2015-05-12 ENCOUNTER — Ambulatory Visit: Payer: BC Managed Care – PPO | Admitting: Skilled Nursing Facility1

## 2016-05-10 ENCOUNTER — Telehealth: Payer: Self-pay

## 2016-05-10 NOTE — Telephone Encounter (Signed)
S/w patient and she stated that she was taking Sprintec birth control pills that she got from [facility] and does not like them. Pt wanted to schedule an appointment to discuss alternative forms birth control.

## 2016-05-23 ENCOUNTER — Encounter: Payer: Self-pay | Admitting: Certified Nurse Midwife

## 2016-05-23 ENCOUNTER — Ambulatory Visit (INDEPENDENT_AMBULATORY_CARE_PROVIDER_SITE_OTHER): Payer: BC Managed Care – PPO | Admitting: Certified Nurse Midwife

## 2016-05-23 VITALS — BP 121/79 | HR 89 | Ht 67.0 in

## 2016-05-23 DIAGNOSIS — Z124 Encounter for screening for malignant neoplasm of cervix: Secondary | ICD-10-CM

## 2016-05-23 DIAGNOSIS — N898 Other specified noninflammatory disorders of vagina: Secondary | ICD-10-CM

## 2016-05-23 DIAGNOSIS — R87820 Cervical low risk human papillomavirus (HPV) DNA test positive: Secondary | ICD-10-CM | POA: Diagnosis not present

## 2016-05-23 DIAGNOSIS — Z1151 Encounter for screening for human papillomavirus (HPV): Secondary | ICD-10-CM | POA: Diagnosis not present

## 2016-05-23 DIAGNOSIS — Z113 Encounter for screening for infections with a predominantly sexual mode of transmission: Secondary | ICD-10-CM

## 2016-05-23 DIAGNOSIS — Z01419 Encounter for gynecological examination (general) (routine) without abnormal findings: Secondary | ICD-10-CM

## 2016-05-23 DIAGNOSIS — Z30015 Encounter for initial prescription of vaginal ring hormonal contraceptive: Secondary | ICD-10-CM | POA: Diagnosis not present

## 2016-05-23 MED ORDER — ETONOGESTREL-ETHINYL ESTRADIOL 0.12-0.015 MG/24HR VA RING: VAGINAL_RING | VAGINAL | 4 refills | Status: DC

## 2016-05-23 NOTE — Progress Notes (Signed)
Subjective:      Stacy [name] is a 35 y.o. female here for a routine exam.  Current complaints: had TAB in July, no period after stopping Sprintec.  Stopped Sprintec d/t HA.  Never tried Seasonale d/t fear of side effects.    Personal health questionnaire:  Is patient Ashkenazi Jewish, have a family history of breast and/or ovarian cancer: no Is there a family history of uterine cancer diagnosed at age < 53, gastrointestinal cancer, urinary tract cancer, family member who is a Field seismologist syndrome-associated carrier: no Is the patient overweight and hypertensive, family history of diabetes, personal history of gestational diabetes, preeclampsia or PCOS: yes Is patient over 64, have PCOS,  family history of premature CHD under age 43, diabetes, smoke, have hypertension or peripheral artery disease:  no At any time, has a partner hit, kicked or otherwise hurt or frightened you?: no Over the past 2 weeks, have you felt down, depressed or hopeless?: no Over the past 2 weeks, have you felt little interest or pleasure in doing things?:no  Gynecologic History Patient's last menstrual period was 02/21/2016. Contraception: OCP (estrogen/progesterone) Last Pap: 10/01/14. Results were: normal Last mammogram: n/a.   Obstetric History OB History  Gravida Para Term Preterm AB Living  1 1 1     1   SAB TAB Ectopic Multiple Live Births          1    # Outcome Date GA Lbr Len/2nd Weight Sex Delivery Anes PTL Lv  1 Term 11/18/12 [redacted]w[redacted]d / 00:05  M Vag-Spont   LIV     Birth Comments: WNL       Past Medical History:  Diagnosis Date  . Medical history non-contributory   . No pertinent past medical history     Past Surgical History:  Procedure Laterality Date  . WISDOM TOOTH EXTRACTION       Current Outpatient Prescriptions:  .  ibuprofen (ADVIL,MOTRIN) 600 MG tablet, Take 1 tablet (600 mg total) by mouth every 6 (six) hours as needed for pain. (Patient not taking: Reported on 05/23/2016), Disp:  30 tablet, Rfl: 5 .  levonorgestrel-ethinyl estradiol (SEASONALE) 0.15-0.03 MG tablet, Take 1 tablet by mouth daily. (Patient not taking: Reported on 02/03/2015), Disp: 1 Package, Rfl: 11 .  niacin 100 MG tablet, Take 1 tablet (100 mg total) by mouth 3 (three) times daily with meals. (Patient not taking: Reported on 05/23/2016), Disp: 100 tablet, Rfl: 1 .  sertraline (ZOLOFT) 50 MG tablet, Take 1 tablet (50 mg total) by mouth daily. (Patient not taking: Reported on 05/23/2016), Disp: 30 tablet, Rfl: 5 No Known Allergies  Social History  Substance Use Topics  . Smoking status: Never Smoker  . Smokeless tobacco: Never Used  . Alcohol use 1.2 oz/week    2 Glasses of wine per week     Comment: wine    Family History  Problem Relation Age of Onset  . Cancer Maternal Grandmother     lung      Review of Systems  Constitutional: negative for fatigue and weight loss Respiratory: negative for cough and wheezing Cardiovascular: negative for chest pain, fatigue and palpitations Gastrointestinal: negative for abdominal pain and change in bowel habits Musculoskeletal:negative for myalgias Neurological: negative for gait problems and tremors Behavioral/Psych: negative for abusive relationship, depression Endocrine: negative for temperature intolerance   Genitourinary:negative for abnormal menstrual periods, genital lesions, hot flashes, sexual problems and vaginal discharge Integument/breast: negative for breast lump, breast tenderness, nipple discharge and skin lesion(s)  Objective:       BP 121/79   Pulse 89   Ht 5\' 7"  (1.702 m)   LMP 02/21/2016  General:   alert  Skin:   no rash or abnormalities  Lungs:   clear to auscultation bilaterally  Heart:   regular rate and rhythm, S1, S2 normal, no murmur, click, rub or gallop  Breasts:   normal without suspicious masses, skin or nipple changes or axillary nodes  Abdomen:  normal findings: no organomegaly, soft, non-tender and no hernia   Pelvis:  External genitalia: normal general appearance Urinary system: urethral meatus normal and bladder without fullness, nontender Vaginal: normal without tenderness, induration or masses Cervix: normal appearance Adnexa: normal bimanual exam Uterus: anteverted and non-tender, normal size   Lab Review Urine pregnancy test Labs reviewed yes Radiologic studies reviewed no  50% of 30 min visit spent on counseling and coordination of care.   Assessment:    Healthy female exam.   contraception management  obesity  recent TAB  Plan:    Education reviewed: calcium supplements, depression evaluation, low fat, low cholesterol diet, safe sex/STD prevention, self breast exams, skin cancer screening and weight bearing exercise. Contraception: NuvaRing vaginal inserts. Follow up in: 3 months.   No orders of the defined types were placed in this encounter.  Orders Placed This Encounter  Procedures  . CBC with Differential/Platelet  . Comprehensive metabolic panel  . TSH  . RPR  . Hepatitis C antibody  . Hepatitis B surface antigen  . HIV antibody  . Hemoglobin A1c    Possible management options include: LARK, LoLo Follow up as needed.

## 2016-05-24 LAB — CBC WITH DIFFERENTIAL/PLATELET
BASOS ABS: 0 10*3/uL (ref 0.0–0.2)
BASOS: 0 %
EOS (ABSOLUTE): 0.1 10*3/uL (ref 0.0–0.4)
Eos: 1 %
HEMOGLOBIN: 12.6 g/dL (ref 11.1–15.9)
Hematocrit: 38.5 % (ref 34.0–46.6)
IMMATURE GRANS (ABS): 0 10*3/uL (ref 0.0–0.1)
IMMATURE GRANULOCYTES: 0 %
LYMPHS: 46 %
Lymphocytes Absolute: 4.3 10*3/uL — ABNORMAL HIGH (ref 0.7–3.1)
MCH: 29.9 pg (ref 26.6–33.0)
MCHC: 32.7 g/dL (ref 31.5–35.7)
MCV: 91 fL (ref 79–97)
Monocytes Absolute: 0.8 10*3/uL (ref 0.1–0.9)
Monocytes: 9 %
NEUTROS PCT: 44 %
Neutrophils Absolute: 4 10*3/uL (ref 1.4–7.0)
PLATELETS: 487 10*3/uL — AB (ref 150–379)
RBC: 4.21 x10E6/uL (ref 3.77–5.28)
RDW: 13.4 % (ref 12.3–15.4)
WBC: 9.2 10*3/uL (ref 3.4–10.8)

## 2016-05-24 LAB — COMPREHENSIVE METABOLIC PANEL
ALT: 10 IU/L (ref 0–32)
AST: 18 IU/L (ref 0–40)
Albumin/Globulin Ratio: 1.3 (ref 1.2–2.2)
Albumin: 4 g/dL (ref 3.5–5.5)
Alkaline Phosphatase: 88 IU/L (ref 39–117)
BUN/Creatinine Ratio: 13 (ref 9–23)
BUN: 12 mg/dL (ref 6–20)
Bilirubin Total: <0.2 mg/dL (ref 0.0–1.2)
CALCIUM: 8.9 mg/dL (ref 8.7–10.2)
CO2: 24 mmol/L (ref 18–29)
Chloride: 104 mmol/L (ref 96–106)
Creatinine, Ser: 0.96 mg/dL (ref 0.57–1.00)
GFR, EST AFRICAN AMERICAN: 89 mL/min/{1.73_m2} (ref >59)
GFR, EST NON AFRICAN AMERICAN: 77 mL/min/{1.73_m2} (ref >59)
GLUCOSE: 84 mg/dL (ref 65–99)
Globulin, Total: 3.2 g/dL (ref 1.5–4.5)
Potassium: 4.3 mmol/L (ref 3.5–5.2)
Sodium: 143 mmol/L (ref 134–144)
TOTAL PROTEIN: 7.2 g/dL (ref 6.0–8.5)

## 2016-05-24 LAB — TSH: TSH: 0.367 u[IU]/mL — ABNORMAL LOW (ref 0.450–4.500)

## 2016-05-24 LAB — HEMOGLOBIN A1C
ESTIMATED AVERAGE GLUCOSE: 103 mg/dL
HEMOGLOBIN A1C: 5.2 % (ref 4.8–5.6)

## 2016-05-24 LAB — CERVICOVAGINAL ANCILLARY ONLY
Chlamydia: NEGATIVE
Neisseria Gonorrhea: NEGATIVE
TRICH (WINDOWPATH): NEGATIVE

## 2016-05-24 LAB — HIV ANTIBODY (ROUTINE TESTING W REFLEX): HIV Screen 4th Generation wRfx: NONREACTIVE

## 2016-05-24 LAB — RPR: RPR Ser Ql: NONREACTIVE

## 2016-05-24 LAB — HEPATITIS B SURFACE ANTIGEN: HEP B S AG: NEGATIVE

## 2016-05-24 LAB — HEPATITIS C ANTIBODY: Hep C Virus Ab: <0.1 s/co ratio (ref 0.0–0.9)

## 2016-05-26 LAB — CYTOLOGY - PAP
DIAGNOSIS: NEGATIVE
HPV 16/18/45 genotyping: NEGATIVE
HPV: DETECTED — AB

## 2016-05-29 LAB — CERVICOVAGINAL ANCILLARY ONLY
Bacterial vaginitis: NEGATIVE
CANDIDA VAGINITIS: NEGATIVE

## 2016-05-31 LAB — SPECIMEN STATUS REPORT

## 2016-05-31 LAB — THYROID PANEL
FREE THYROXINE INDEX: 2.6 (ref 1.2–4.9)
T3 UPTAKE RATIO: 26 % (ref 24–39)
T4, Total: 9.9 ug/dL (ref 4.5–12.0)

## 2018-01-08 ENCOUNTER — Ambulatory Visit (INDEPENDENT_AMBULATORY_CARE_PROVIDER_SITE_OTHER): Payer: BC Managed Care – PPO | Admitting: Certified Nurse Midwife

## 2018-01-08 ENCOUNTER — Encounter: Payer: Self-pay | Admitting: Certified Nurse Midwife

## 2018-01-08 VITALS — BP 136/91 | HR 91 | Ht 67.0 in | Wt 260.0 lb

## 2018-01-08 DIAGNOSIS — Z1151 Encounter for screening for human papillomavirus (HPV): Secondary | ICD-10-CM | POA: Diagnosis not present

## 2018-01-08 DIAGNOSIS — Z23 Encounter for immunization: Secondary | ICD-10-CM

## 2018-01-08 DIAGNOSIS — N898 Other specified noninflammatory disorders of vagina: Secondary | ICD-10-CM

## 2018-01-08 DIAGNOSIS — Z124 Encounter for screening for malignant neoplasm of cervix: Secondary | ICD-10-CM | POA: Diagnosis not present

## 2018-01-08 DIAGNOSIS — A599 Trichomoniasis, unspecified: Secondary | ICD-10-CM

## 2018-01-08 DIAGNOSIS — Z01419 Encounter for gynecological examination (general) (routine) without abnormal findings: Secondary | ICD-10-CM | POA: Diagnosis not present

## 2018-01-08 DIAGNOSIS — Z113 Encounter for screening for infections with a predominantly sexual mode of transmission: Secondary | ICD-10-CM | POA: Diagnosis not present

## 2018-01-08 NOTE — Progress Notes (Signed)
Patient is in the office for annual, last pap 05-23-16. Patient reports vaginal discharge, wants std testing. Pt declined birth control, states that she still has NuvaRing, but is not currently using because she is not sexually active.  Administered 1st Gardasil injection and pt tolerated well.

## 2018-01-08 NOTE — Progress Notes (Signed)
Subjective:        Stacy [name] is a 37 y.o. female here for a routine exam.  Current complaints: vaginal discharge with odor.  Reports using disposable period disk.  Declines blood STD testing.  Is not currently sexually active.  Is a high school Psychologist, prison and probation services.  Gardasil series discussed.   Discussed change in diet, exercise.  Elevated blood pressure today, no hx of HTN.  Probiotics recommended.   Personal health questionnaire:  Is patient Ashkenazi Jewish, have a family history of breast and/or ovarian cancer: no Is there a family history of uterine cancer diagnosed at age < 42, gastrointestinal cancer, urinary tract cancer, family member who is a Field seismologist syndrome-associated carrier: no Is the patient overweight and hypertensive, family history of diabetes, personal history of gestational diabetes, preeclampsia or PCOS: yes Is patient over 42, have PCOS, family history of premature CHD under age 79, diabetes, smoke, have hypertension or peripheral artery disease: no At any time, has a partner hit, kicked or otherwise hurt or frightened you?: no Over the past 2 weeks, have you felt down, depressed or hopeless?: no Over the past 2 weeks, have you felt little interest or pleasure in doing things?:no   Gynecologic History No LMP recorded. Contraception: abstinence Last Pap: 05/26/16. Results were: Normal, +HPV, negative 16/18/45 Last mammogram: n/a <40 years, no significant family hx.   Obstetric History OB History  Gravida Para Term Preterm AB Living  1 1 1     1   SAB TAB Ectopic Multiple Live Births          1    # Outcome Date GA Lbr Len/2nd Weight Sex Delivery Anes PTL Lv  1 Term 11/18/12 [redacted]w[redacted]d / 00:05  M Vag-Spont   LIV     Birth Comments: WNL     Past Medical History:  Diagnosis Date  . Medical history non-contributory   . No pertinent past medical history     Past Surgical History:  Procedure Laterality Date  . WISDOM TOOTH EXTRACTION       Current  Outpatient Medications:  .  etonogestrel-ethinyl estradiol (NUVARING) 0.12-0.015 MG/24HR vaginal ring, Insert vaginally and leave in place for 4 consecutive weeks, then remove and replace. (Patient not taking: Reported on 01/08/2018), Disp: 3 each, Rfl: 4 .  ibuprofen (ADVIL,MOTRIN) 600 MG tablet, Take 1 tablet (600 mg total) by mouth every 6 (six) hours as needed for pain. (Patient not taking: Reported on 05/23/2016), Disp: 30 tablet, Rfl: 5 .  levonorgestrel-ethinyl estradiol (SEASONALE) 0.15-0.03 MG tablet, Take 1 tablet by mouth daily. (Patient not taking: Reported on 02/03/2015), Disp: 1 Package, Rfl: 11 .  niacin 100 MG tablet, Take 1 tablet (100 mg total) by mouth 3 (three) times daily with meals. (Patient not taking: Reported on 05/23/2016), Disp: 100 tablet, Rfl: 1 .  sertraline (ZOLOFT) 50 MG tablet, Take 1 tablet (50 mg total) by mouth daily. (Patient not taking: Reported on 05/23/2016), Disp: 30 tablet, Rfl: 5 No Known Allergies  Social History   Tobacco Use  . Smoking status: Never Smoker  . Smokeless tobacco: Never Used  Substance Use Topics  . Alcohol use: Yes    Alcohol/week: 1.2 oz    Types: 2 Glasses of wine per week    Comment: wine    Family History  Problem Relation Age of Onset  . Cancer Maternal Grandmother        lung      Review of Systems  Constitutional: negative for fatigue and  weight loss Respiratory: negative for cough and wheezing Cardiovascular: negative for chest pain, fatigue and palpitations Gastrointestinal: negative for abdominal pain and change in bowel habits Musculoskeletal:negative for myalgias Neurological: negative for gait problems and tremors Behavioral/Psych: negative for abusive relationship, depression Endocrine: negative for temperature intolerance    Genitourinary:negative for abnormal menstrual periods, genital lesions, hot flashes, sexual problems and + for vaginal discharge Integument/breast: negative for breast lump, breast  tenderness, nipple discharge and skin lesion(s)    Objective:       BP (!) 136/91   Pulse 91   Ht 5\' 7"  (1.702 m)   Wt 260 lb (117.9 kg)   BMI 40.72 kg/m  General:   alert  Skin:   no rash or abnormalities  Lungs:   clear to auscultation bilaterally  Heart:   regular rate and rhythm, S1, S2 normal, no murmur, click, rub or gallop  Breasts:   normal without suspicious masses, skin or nipple changes or axillary nodes  Abdomen:  normal findings: no organomegaly, soft, non-tender and no hernia  Pelvis:  External genitalia: normal general appearance, left labia 1 lesion noted, non-tender to palpation, non draining, small.  Urinary system: urethral meatus normal and bladder without fullness, nontender Vaginal: normal without tenderness, induration or masses, slight discharge noted. Cervix: normal appearance Adnexa: normal bimanual exam Uterus: anteverted and non-tender, normal size   Lab Review Urine pregnancy test Labs reviewed yes Radiologic studies reviewed no  50% of 30 min visit spent on counseling and coordination of care.   Assessment & Plan    Healthy female exam.    1. Vaginal discharge    - Cervicovaginal ancillary only  2. Screen for STD (sexually transmitted disease)    - Cervicovaginal ancillary only  3. 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 Contraception: abstinence. Follow up in: 1 year for annual exam, 2 months for Gardasil injection.   No orders of the defined types were placed in this encounter.  Orders Placed This Encounter  Procedures  . HPV 9-valent vaccine,Recombinat    Possible management options include: LARK Follow up as needed.

## 2018-01-10 ENCOUNTER — Other Ambulatory Visit: Payer: Self-pay | Admitting: Certified Nurse Midwife

## 2018-01-10 DIAGNOSIS — A599 Trichomoniasis, unspecified: Secondary | ICD-10-CM | POA: Insufficient documentation

## 2018-01-10 LAB — CERVICOVAGINAL ANCILLARY ONLY
Bacterial vaginitis: NEGATIVE
CHLAMYDIA, DNA PROBE: NEGATIVE
Candida vaginitis: NEGATIVE
Neisseria Gonorrhea: NEGATIVE
Trichomonas: POSITIVE — AB

## 2018-01-10 LAB — CYTOLOGY - PAP
Diagnosis: NEGATIVE
HPV: NOT DETECTED

## 2018-01-10 MED ORDER — METRONIDAZOLE 500 MG PO TABS: 2000.0000 mg | ORAL_TABLET | Freq: Once | ORAL | 0 refills | Status: AC

## 2018-01-15 ENCOUNTER — Other Ambulatory Visit: Payer: Self-pay | Admitting: Certified Nurse Midwife

## 2018-02-06 ENCOUNTER — Ambulatory Visit: Payer: BC Managed Care – PPO

## 2018-02-06 ENCOUNTER — Encounter: Payer: Self-pay | Admitting: Obstetrics

## 2018-02-06 ENCOUNTER — Ambulatory Visit (INDEPENDENT_AMBULATORY_CARE_PROVIDER_SITE_OTHER): Payer: BC Managed Care – PPO

## 2018-02-06 VITALS — BP 130/90 | HR 70 | Ht 67.0 in | Wt 263.1 lb

## 2018-02-06 DIAGNOSIS — A5901 Trichomonal vulvovaginitis: Secondary | ICD-10-CM

## 2018-02-06 DIAGNOSIS — A599 Trichomoniasis, unspecified: Secondary | ICD-10-CM | POA: Diagnosis not present

## 2018-02-06 NOTE — Progress Notes (Signed)
Pt presents for TOC Trich. Pt requests to do self swab.

## 2018-02-08 LAB — CERVICOVAGINAL ANCILLARY ONLY: TRICH (WINDOWPATH): NEGATIVE

## 2018-03-12 ENCOUNTER — Ambulatory Visit: Payer: BC Managed Care – PPO

## 2018-03-18 ENCOUNTER — Ambulatory Visit (INDEPENDENT_AMBULATORY_CARE_PROVIDER_SITE_OTHER): Payer: BC Managed Care – PPO

## 2018-03-18 ENCOUNTER — Encounter: Payer: Self-pay | Admitting: Obstetrics

## 2018-03-18 VITALS — BP 147/9 | HR 70 | Wt 254.3 lb

## 2018-03-18 DIAGNOSIS — Z8619 Personal history of other infectious and parasitic diseases: Secondary | ICD-10-CM

## 2018-03-18 DIAGNOSIS — Z23 Encounter for immunization: Secondary | ICD-10-CM | POA: Diagnosis not present

## 2018-03-18 NOTE — Progress Notes (Signed)
Presents for 2nd Gardasil injection, given in right deltoid, tolerated well.  3rd. Gardasil Injection 09/18/2018  Office stock used.

## 2018-05-09 ENCOUNTER — Ambulatory Visit: Payer: BC Managed Care – PPO

## 2018-09-18 ENCOUNTER — Ambulatory Visit: Payer: BC Managed Care – PPO

## 2018-09-19 ENCOUNTER — Ambulatory Visit (INDEPENDENT_AMBULATORY_CARE_PROVIDER_SITE_OTHER): Payer: BC Managed Care – PPO

## 2018-09-19 DIAGNOSIS — Z8619 Personal history of other infectious and parasitic diseases: Secondary | ICD-10-CM

## 2018-09-19 DIAGNOSIS — Z23 Encounter for immunization: Secondary | ICD-10-CM | POA: Diagnosis not present

## 2018-09-19 NOTE — Progress Notes (Signed)
Patient is in the office for 3rd gardasil injection. Pt is 16months late to injection, stated that she was mis-informed of appropiate time frame for injection. Provider approved for pt to get injection today, administered in L Del and pt tolerated well.

## 2018-11-27 ENCOUNTER — Other Ambulatory Visit: Payer: Self-pay

## 2018-11-27 ENCOUNTER — Ambulatory Visit (INDEPENDENT_AMBULATORY_CARE_PROVIDER_SITE_OTHER): Payer: BC Managed Care – PPO | Admitting: Obstetrics

## 2018-11-27 ENCOUNTER — Encounter: Payer: Self-pay | Admitting: Obstetrics

## 2018-11-27 DIAGNOSIS — N898 Other specified noninflammatory disorders of vagina: Secondary | ICD-10-CM | POA: Diagnosis not present

## 2018-11-27 MED ORDER — TINIDAZOLE 500 MG PO TABS: 1000.0000 mg | ORAL_TABLET | Freq: Every day | ORAL | 2 refills | Status: DC

## 2018-11-27 NOTE — Progress Notes (Signed)
Pt c/o light brown vaginal discharge lasting several weeks after menses. Denies itching/Denies odor.

## 2018-11-27 NOTE — Progress Notes (Signed)
   TELEHEALTH [place] VISIT ENCOUNTER NOTE  I connected with@ on 11/27/18 at  1:30 PM EDT by WebEx at home and verified that I am speaking with the correct person using two identifiers.   I discussed the limitations, risks, security and privacy concerns of performing an evaluation and management service by telephone and the availability of in person appointments. I also discussed with the patient that there may be a patient responsible charge related to this service. The patient expressed understanding and agreed to proceed.   History:  Stacy [name] is a 38 y.o. G89P1011 female being evaluated today for brownish vaginal discharge after her period. She denies any abnormal vaginal bleeding, pelvic pain or other concerns.  Denies vaginal irritation or odor.    Past Medical History:  Diagnosis Date  . Medical history non-contributory   . No pertinent past medical history    Past Surgical History:  Procedure Laterality Date  . WISDOM TOOTH EXTRACTION     The following portions of the patient's history were reviewed and updated as appropriate: allergies, current medications, past family history, past medical history, past social history, past surgical history and problem list.   Health Maintenance:  Normal pap and negative HRHPV on 01-08-2018.    Review of Systems:  Pertinent items noted in HPI and remainder of comprehensive ROS otherwise negative.  Physical Exam:   General:  Alert, oriented and cooperative. Patient appears to be in no acute distress.  Mental Status: Normal mood and affect. Normal behavior. Normal judgment and thought content.   Respiratory: Normal respiratory effort, no problems with respiration noted  Rest of physical exam deferred due to type of encounter  Labs and Imaging No results found for this or any previous visit (from the past 336 hour(s)). No results found.     Assessment and Plan:     1. Vaginal discharge, probable BV Rx: - tinidazole  (TINDAMAX) 500 MG tablet; Take 2 tablets (1,000 mg total) by mouth daily with breakfast.  Dispense: 10 tablet; Refill: 2       I discussed the assessment and treatment plan with the patient. The patient was provided an opportunity to ask questions and all were answered. The patient agreed with the plan and demonstrated an understanding of the instructions.   The patient was advised to call back or seek an in-person evaluation/go to the ED if the symptoms worsen or if the condition fails to improve as anticipated.  I provided 10 minutes of face-to-face via WebEx time during this encounter.   Baltazar Najjar, MD [facility] 11-27-2018

## 2018-12-25 ENCOUNTER — Telehealth: Payer: Self-pay | Admitting: *Deleted

## 2018-12-25 NOTE — Telephone Encounter (Signed)
Pt had web visit with provider end of April. Pt was given Tinidazole for symptoms. Pt completed tx and is still have discharge.  Pt denies any itch/irrit/odor at this time. Pt would like to know if she needs an appt for gyn exam, ?selfswab.  Pt made aware message to be sent to provider for recommendation.

## 2018-12-26 NOTE — Telephone Encounter (Signed)
OK for self swab.

## 2018-12-31 ENCOUNTER — Other Ambulatory Visit: Payer: Self-pay

## 2018-12-31 ENCOUNTER — Ambulatory Visit (INDEPENDENT_AMBULATORY_CARE_PROVIDER_SITE_OTHER): Payer: BC Managed Care – PPO

## 2018-12-31 VITALS — BP 136/85 | HR 99 | Ht 67.0 in | Wt 253.7 lb

## 2018-12-31 DIAGNOSIS — N898 Other specified noninflammatory disorders of vagina: Secondary | ICD-10-CM | POA: Diagnosis not present

## 2018-12-31 DIAGNOSIS — B9689 Other specified bacterial agents as the cause of diseases classified elsewhere: Secondary | ICD-10-CM

## 2018-12-31 DIAGNOSIS — Z113 Encounter for screening for infections with a predominantly sexual mode of transmission: Secondary | ICD-10-CM

## 2018-12-31 DIAGNOSIS — N760 Acute vaginitis: Secondary | ICD-10-CM | POA: Diagnosis not present

## 2018-12-31 DIAGNOSIS — N390 Urinary tract infection, site not specified: Secondary | ICD-10-CM | POA: Diagnosis not present

## 2018-12-31 DIAGNOSIS — R319 Hematuria, unspecified: Secondary | ICD-10-CM | POA: Diagnosis not present

## 2018-12-31 LAB — POCT URINALYSIS DIPSTICK
Glucose, UA: NEGATIVE
Nitrite, UA: NEGATIVE
Protein, UA: POSITIVE — AB
Spec Grav, UA: 1.02 (ref 1.010–1.025)
Urobilinogen, UA: 0.2 E.U./dL
pH, UA: 5 (ref 5.0–8.0)

## 2018-12-31 NOTE — Progress Notes (Signed)
SUBJECTIVE: Stacy [name] is a 38 y.o. female who complains of urinary frequency, urgency and dysuria x 7 days, without flank pain, fever, chills. C/o brown  vaginal discharge and bleeding x 2 weeks  OBJECTIVE: Appears well, in no apparent distress.  Vital signs are normal. Urine dipstick shows positive for leukocytes, protein.    ASSESSMENT: Dysuria  PLAN: Treatment per Urine Culture and Self Swab sent to Lab. Awaiting results  Call or return to clinic prn if these symptoms worsen or fail to improve as anticipated.

## 2019-01-02 ENCOUNTER — Ambulatory Visit: Payer: BC Managed Care – PPO

## 2019-01-02 LAB — URINE CULTURE

## 2019-01-03 ENCOUNTER — Other Ambulatory Visit: Payer: Self-pay | Admitting: Obstetrics

## 2019-01-03 DIAGNOSIS — N898 Other specified noninflammatory disorders of vagina: Secondary | ICD-10-CM

## 2019-01-03 LAB — CERVICOVAGINAL ANCILLARY ONLY
Bacterial vaginitis: POSITIVE — AB
Candida vaginitis: NEGATIVE
Chlamydia: NEGATIVE
Neisseria Gonorrhea: NEGATIVE
Trichomonas: NEGATIVE

## 2019-01-03 MED ORDER — TINIDAZOLE 500 MG PO TABS: 1000.0000 mg | ORAL_TABLET | Freq: Every day | ORAL | 2 refills | Status: DC

## 2019-01-13 ENCOUNTER — Other Ambulatory Visit: Payer: Self-pay | Admitting: Obstetrics

## 2019-01-13 DIAGNOSIS — N760 Acute vaginitis: Secondary | ICD-10-CM

## 2019-01-13 DIAGNOSIS — B9689 Other specified bacterial agents as the cause of diseases classified elsewhere: Secondary | ICD-10-CM

## 2019-01-13 MED ORDER — METRONIDAZOLE 0.75 % VA GEL: 1.0000 | Freq: Two times a day (BID) | VAGINAL | 5 refills | Status: DC

## 2019-02-13 ENCOUNTER — Ambulatory Visit: Payer: BC Managed Care – PPO

## 2019-02-13 ENCOUNTER — Other Ambulatory Visit: Payer: Self-pay

## 2019-02-13 VITALS — BP 126/88 | HR 82 | Ht 67.0 in | Wt 257.0 lb

## 2019-02-13 DIAGNOSIS — N938 Other specified abnormal uterine and vaginal bleeding: Secondary | ICD-10-CM | POA: Insufficient documentation

## 2019-02-13 DIAGNOSIS — N898 Other specified noninflammatory disorders of vagina: Secondary | ICD-10-CM | POA: Diagnosis not present

## 2019-02-13 NOTE — Patient Instructions (Signed)
Dysfunctional Uterine Bleeding Dysfunctional uterine bleeding is abnormal bleeding from the uterus. Dysfunctional uterine bleeding includes:  A menstrual period that comes earlier or later than usual.  A menstrual period that is lighter or heavier than usual, or has large blood clots.  Vaginal bleeding between menstrual periods.  Skipping one or more menstrual periods.  Vaginal bleeding after sex.  Vaginal bleeding after menopause. Follow these instructions at home: Eating and drinking   Eat well-balanced meals. Include foods that are high in iron, such as liver, meat, shellfish, green leafy vegetables, and eggs.  To prevent or treat constipation, your health care provider may recommend that you: ? Drink enough fluid to keep your urine pale yellow. ? Take over-the-counter or prescription medicines. ? Eat foods that are high in fiber, such as beans, whole grains, and fresh fruits and vegetables. ? Limit foods that are high in fat and processed sugars, such as fried or sweet foods. Medicines  Take over-the-counter and prescription medicines only as told by your health care provider.  Do not change medicines without talking with your health care provider.  Aspirin or medicines that contain aspirin may make the bleeding worse. Do not take those medicines: ? During the week before your menstrual period. ? During your menstrual period.  If you were prescribed iron pills, take them as told by your health care provider. Iron pills help to replace iron that your body loses because of this condition. Activity  If you need to change your sanitary pad or tampon more than one time every 2 hours: ? Lie in bed with your feet raised (elevated). ? Place a cold pack on your lower abdomen. ? Rest as much as possible until the bleeding stops or slows down.  Do not try to lose weight until the bleeding has stopped and your blood iron level is back to normal. General instructions   For two  months, write down: ? When your menstrual period starts. ? When your menstrual period ends. ? When any abnormal vaginal bleeding occurs. ? What problems you notice.  Keep all follow up visits as told by your health care provider. This is important. Contact a health care provider if you:  Feel light-headed or weak.  Have nausea and vomiting.  Cannot eat or drink without vomiting.  Feel dizzy or have diarrhea while you are taking medicines.  Are taking birth control pills or hormones, and you want to change them or stop taking them. Get help right away if:  You develop a fever or chills.  You need to change your sanitary pad or tampon more than one time per hour.  Your vaginal bleeding becomes heavier, or your flow contains clots more often.  You develop pain in your abdomen.  You lose consciousness.  You develop a rash. Summary  Dysfunctional uterine bleeding is abnormal bleeding from the uterus.  It includes menstrual bleeding of abnormal duration, volume, or regularity.  Bleeding after sex and after menopause are also considered dysfunctional uterine bleeding. This information is not intended to replace advice given to you by your health care provider. Make sure you discuss any questions you have with your health care provider. Document Released: 07/14/2000 Document Revised: 12/26/2017 Document Reviewed: 12/26/2017 Elsevier Patient Education  2020 [employer].

## 2019-02-13 NOTE — Progress Notes (Signed)
GYN presented for Vaginal Bleeding.  C/o brown, old blood for up to 2 weeks after her periods; uses a tampon or pad at times.  Denies abdominal pain, odor, fever, chills, NV.

## 2019-02-13 NOTE — Progress Notes (Signed)
GYNECOLOGY OFFICE VISIT NOTE  History:   Stacy [name] is a 38 year old G2P1011 here today for vaginal bleeding/discharge that has been "lingering" after her normal periods since January.  She reports that the brown discharge can last up to 2 weeks.  She states the bleeding is not consistent, but requests panty liner, pad, or tampon usage daily depending on the flow.  She does endorse a smell "like a fresh period." She states that she was recently treated for BV with pills x 2 and gel.  She is currently not on birth control or sexual active.  She does endorse a history of uterine fibroids, but has not had them evaluated since they were discovered 6 years ago during her pregnancy.  She denies any pelvic pain or other concerns.   Past Medical History:  Diagnosis Date  . Medical history non-contributory   . No pertinent past medical history     Past Surgical History:  Procedure Laterality Date  . WISDOM TOOTH EXTRACTION      The following portions of the patient's history were reviewed and updated as appropriate: allergies, current medications, past family history, past medical history, past social history, past surgical history and problem list.   Health Maintenance:  Normal pap and negative HRHPV on June 2019.  No mammogram d/t age.   Review of Systems:  Pertinent items noted in HPI and remainder of comprehensive ROS otherwise negative.    Objective:    Physical Exam BP 126/88   Pulse 82   Ht 5\' 7"  (1.702 m)   Wt 257 lb (116.6 kg)   LMP 01/30/2019 (Exact Date)   BMI 40.25 kg/m  Physical Exam Exam conducted with a chaperone present.  Constitutional:      Appearance: Normal appearance. She is obese.  HENT:     Head: Normocephalic and atraumatic.     Mouth/Throat:     Mouth: Mucous membranes are moist.  Eyes:     Conjunctiva/sclera: Conjunctivae normal.  Neck:     Musculoskeletal: Normal range of motion.  Pulmonary:     Effort: Pulmonary effort is normal.   Genitourinary:    Labia:        Right: No lesion.        Left: No lesion.      Vagina: Vaginal discharge present. No bleeding.     Comments: Speculum Exam: -Vaginal Vault: Pink mucosa.  Small amt light brown and scant amt curdy white discharge -wet prep collected -Cervix:Difficult to visualize due to position.   -Bimanual Exam: Difficult to assess d/t body habitus and uterine position.  Musculoskeletal: Normal range of motion.  Skin:    General: Skin is warm.  Neurological:     Mental Status: She is alert and oriented to person, place, and time.  Psychiatric:        Mood and Affect: Mood normal.        Thought Content: Thought content normal.     Labs and Imaging No results found for this or any previous visit (from the past 168 hour(s)). No results found.   Assessment & Plan:       1. Dysfunctional uterine bleeding -Discussed need for pelvic US to evaluate fibroids and endometrium lining. -Informed that will schedule within next 1-2 weeks. -Will send CV test to examine vaginal discharge.  GC/CT deferred d/t recent negative history and no partner. -Patient expresses relief with progression in plan to determine uterine bleeding. -No questions or concerns. -Informed that provider would  contact with CV and [place] results once they arrive. - US PELVIC COMPLETE WITH TRANSVAGINAL; Future - Cervicovaginal ancillary only   Routine preventative health maintenance measures emphasized. Please refer to After Visit Summary for other counseling recommendations.   Return if symptoms worsen or fail to improve.      Maryann Conners, CNM 02/13/2019

## 2019-02-17 LAB — CERVICOVAGINAL ANCILLARY ONLY
Bacterial vaginitis: NEGATIVE
Candida vaginitis: NEGATIVE

## 2019-02-25 ENCOUNTER — Ambulatory Visit (HOSPITAL_COMMUNITY)
Admission: RE | Admit: 2019-02-25 | Discharge: 2019-02-25 | Disposition: A | Discharge status: Home / Self Care | Payer: BC Managed Care – PPO | Source: Ambulatory Visit

## 2019-02-25 ENCOUNTER — Other Ambulatory Visit: Payer: Self-pay

## 2019-02-25 DIAGNOSIS — N938 Other specified abnormal uterine and vaginal bleeding: Secondary | ICD-10-CM

## 2019-03-10 ENCOUNTER — Encounter: Payer: Self-pay | Admitting: Obstetrics & Gynecology

## 2019-03-10 ENCOUNTER — Ambulatory Visit: Payer: BC Managed Care – PPO | Admitting: Obstetrics & Gynecology

## 2019-03-10 ENCOUNTER — Telehealth: Payer: Self-pay | Admitting: Obstetrics & Gynecology

## 2019-03-10 ENCOUNTER — Other Ambulatory Visit: Payer: Self-pay

## 2019-03-10 VITALS — BP 146/93 | HR 88 | Temp 99.5°F | Ht 67.0 in | Wt 257.9 lb

## 2019-03-10 DIAGNOSIS — N9489 Other specified conditions associated with female genital organs and menstrual cycle: Secondary | ICD-10-CM | POA: Diagnosis not present

## 2019-03-10 DIAGNOSIS — N938 Other specified abnormal uterine and vaginal bleeding: Secondary | ICD-10-CM

## 2019-03-10 NOTE — Progress Notes (Signed)
Patient ID: Stacy [name], female   DOB: 11/10/1980, 38 y.o.   MRN: [id number]  No chief complaint on file.   HPI Stacy [name] is a 38 y.o. single P88 (38 yo son) here today for followup after u/s done last month. This was done for DUB, sometimes heavy.  FH- no breast/gyn/colon cancer   Past Medical History:  Diagnosis Date  . Medical history non-contributory   . No pertinent past medical history     Past Surgical History:  Procedure Laterality Date  . WISDOM TOOTH EXTRACTION      Family History  Problem Relation Age of Onset  . Cancer Maternal Grandmother        lung    Social History Social History   Tobacco Use  . Smoking status: Never Smoker  . Smokeless tobacco: Never Used  Substance Use Topics  . Alcohol use: Yes    Alcohol/week: 2.0 standard drinks    Types: 2 Glasses of wine per week    Comment: wine  . Drug use: No    No Known Allergies  Current Outpatient Medications  Medication Sig Dispense Refill  . metroNIDAZOLE (METROGEL VAGINAL) 0.75 % vaginal gel Place 1 Applicatorful vaginally 2 (two) times daily. (Patient not taking: Reported on 02/13/2019) 70 g 5  . tinidazole (TINDAMAX) 500 MG tablet Take 2 tablets (1,000 mg total) by mouth daily with breakfast. (Patient not taking: Reported on 02/13/2019) 10 tablet 2   No current facility-administered medications for this visit.     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  There were no vitals taken for this visit.  Physical Exam Physical Exam  Data Reviewed ultrasound  Assessment DUB Complex adnexal mass  Plan Check CA 125 and CEA  I will call her with results   [place] 03/10/2019, 4:20 PM

## 2019-03-11 ENCOUNTER — Telehealth: Payer: Self-pay | Admitting: Obstetrics & Gynecology

## 2019-03-11 LAB — CEA: CEA: 1.1 ng/mL (ref 0.0–4.7)

## 2019-03-11 LAB — CBC
Hematocrit: 34.9 % (ref 34.0–46.6)
Hemoglobin: 11.1 g/dL (ref 11.1–15.9)
MCH: 27.4 pg (ref 26.6–33.0)
MCHC: 31.8 g/dL (ref 31.5–35.7)
MCV: 86 fL (ref 79–97)
Platelets: 559 10*3/uL — ABNORMAL HIGH (ref 150–450)
RBC: 4.05 x10E6/uL (ref 3.77–5.28)
RDW: 12.1 % (ref 11.7–15.4)
WBC: 8.8 10*3/uL (ref 3.4–10.8)

## 2019-03-11 LAB — TSH: TSH: 1.31 u[IU]/mL (ref 0.450–4.500)

## 2019-03-11 LAB — CA 125: Cancer Antigen (CA) 125: 22.2 U/mL (ref 0.0–38.1)

## 2019-03-11 NOTE — Telephone Encounter (Signed)
I called her and discussed her blood work. She opts for attempt at laparoscopic removal of the adnexal mass. She understands that I may have to do an open procedure. She also has some DUB for 6 months so I will add a d&c. She does not want to have a hysterectomy unless it is necessary.

## 2019-03-29 ENCOUNTER — Other Ambulatory Visit (HOSPITAL_COMMUNITY): Payer: BC Managed Care – PPO | Attending: Obstetrics & Gynecology

## 2019-03-31 ENCOUNTER — Telehealth: Payer: Self-pay | Admitting: Obstetrics and Gynecology

## 2019-03-31 NOTE — Progress Notes (Signed)
Spoke with Stacy [name] this morning,  She stated that she has already contacted Dr. Audie Box office to cancel her surgery at this time. She was not sure if they received her message about canceling surgery.  Instructed that she may want to reach out again to make sure they receive the message.  Patient will not need a covid test

## 2019-03-31 NOTE — Telephone Encounter (Signed)
The patient called in stating she would like to cancel the upcoming surgery appointment for April 02, 2019. I Stacy [name]) called Drema Balzarine and left a voicemail of the requested cancellation.

## 2019-04-02 ENCOUNTER — Ambulatory Visit (HOSPITAL_BASED_OUTPATIENT_CLINIC_OR_DEPARTMENT_OTHER)
Admission: RE | Admit: 2019-04-02 | Payer: BC Managed Care – PPO | Source: Home / Self Care | Admitting: Obstetrics & Gynecology

## 2019-04-02 ENCOUNTER — Encounter (HOSPITAL_BASED_OUTPATIENT_CLINIC_OR_DEPARTMENT_OTHER): Admission: RE | Payer: Self-pay | Source: Home / Self Care

## 2019-04-02 SURGERY — LAPAROSCOPY, DIAGNOSTIC
Anesthesia: Choice

## 2019-04-09 DIAGNOSIS — N939 Abnormal uterine and vaginal bleeding, unspecified: Secondary | ICD-10-CM

## 2019-04-09 DIAGNOSIS — N838 Other noninflammatory disorders of ovary, fallopian tube and broad ligament: Secondary | ICD-10-CM

## 2019-04-09 HISTORY — DX: Abnormal uterine and vaginal bleeding, unspecified: N93.9

## 2019-04-09 HISTORY — DX: Other noninflammatory disorders of ovary, fallopian tube and broad ligament: N83.8

## 2019-04-21 ENCOUNTER — Telehealth: Payer: Self-pay | Admitting: *Deleted

## 2019-04-21 DIAGNOSIS — D259 Leiomyoma of uterus, unspecified: Secondary | ICD-10-CM

## 2019-04-21 HISTORY — DX: Leiomyoma of uterus, unspecified: D25.9

## 2019-04-21 NOTE — Telephone Encounter (Signed)
Called and spoke with the patient. Scheduled new patient appt, gave the instructions for parking and visitors

## 2019-05-02 ENCOUNTER — Other Ambulatory Visit: Payer: Self-pay | Admitting: Gynecologic Oncology

## 2019-05-02 ENCOUNTER — Inpatient Hospital Stay: Payer: BC Managed Care – PPO | Attending: Gynecologic Oncology | Admitting: Gynecologic Oncology

## 2019-05-02 ENCOUNTER — Other Ambulatory Visit: Payer: Self-pay

## 2019-05-02 ENCOUNTER — Encounter: Payer: Self-pay | Admitting: Gynecologic Oncology

## 2019-05-02 VITALS — BP 135/88 | HR 79 | Temp 98.2°F | Resp 18 | Ht 66.5 in | Wt 257.0 lb

## 2019-05-02 DIAGNOSIS — D398 Neoplasm of uncertain behavior of other specified female genital organs: Secondary | ICD-10-CM | POA: Diagnosis present

## 2019-05-02 DIAGNOSIS — N9489 Other specified conditions associated with female genital organs and menstrual cycle: Secondary | ICD-10-CM

## 2019-05-02 DIAGNOSIS — D259 Leiomyoma of uterus, unspecified: Secondary | ICD-10-CM | POA: Diagnosis not present

## 2019-05-02 DIAGNOSIS — Z6841 Body Mass Index (BMI) 40.0 and over, adult: Secondary | ICD-10-CM | POA: Diagnosis not present

## 2019-05-02 DIAGNOSIS — A630 Anogenital (venereal) warts: Secondary | ICD-10-CM | POA: Insufficient documentation

## 2019-05-02 DIAGNOSIS — N939 Abnormal uterine and vaginal bleeding, unspecified: Secondary | ICD-10-CM | POA: Insufficient documentation

## 2019-05-02 MED ORDER — SENNOSIDES-DOCUSATE SODIUM 8.6-50 MG PO TABS: 2.0000 | ORAL_TABLET | Freq: Every day | ORAL | 1 refills | Status: DC

## 2019-05-02 MED ORDER — IBUPROFEN 800 MG PO TABS: 800.0000 mg | ORAL_TABLET | Freq: Three times a day (TID) | ORAL | 0 refills | Status: DC | PRN

## 2019-05-02 MED ORDER — TRAMADOL HCL 50 MG PO TABS: 50.0000 mg | ORAL_TABLET | Freq: Four times a day (QID) | ORAL | 0 refills | Status: DC | PRN

## 2019-05-02 NOTE — Patient Instructions (Addendum)
Plan to have a CT scan of the abdomen and pelvis and we will contact you with the results.  Preparing for your Surgery  Plan for surgery on June 03, 2019 with Dr. Everitt Amber at [facility] will be scheduled for a robotic assisted laparoscopic bilateral ovarian cystectomies, possible total laparoscopic hysterectomy/BSO staging if cancer is identified.  You will be able to go home the same day.   Pre-operative Testing -You will receive a phone call from presurgical testing at [facility] if you have not received a call already to arrange for a pre-operative testing appointment before your surgery.  This appointment normally occurs one to two weeks before your scheduled surgery.   -Bring your insurance card, copy of an advanced directive if applicable, medication list  -At that visit, you will be asked to sign a consent for a possible blood transfusion in case a transfusion becomes necessary during surgery.  The need for a blood transfusion is rare but having consent is a necessary part of your care.     -You should not be taking blood thinners or aspirin at least ten days prior to surgery unless instructed by your surgeon. Avoid taking ibuprofen 10 days before surgery.  -Do not take supplements such as fish oil (omega 3), red yeast rice, tumeric before your surgery.  Day Before Surgery at [facility] will be asked to take in a light diet the day before surgery.  Avoid carbonated beverages.  You will be advised to have nothing to eat or drink after midnight the evening before.    Eat a light diet the day before surgery.  Examples including soups, broths, toast, yogurt, mashed potatoes.  Things to avoid include carbonated beverages (fizzy beverages), raw fruits and raw vegetables, or beans.   If your bowels are filled with gas, your surgeon will have difficulty visualizing your pelvic organs which increases your surgical risks.  Your role in recovery Your role is to  become active as soon as directed by your doctor, while still giving yourself time to heal.  Rest when you feel tired. You will be asked to do the following in order to speed your recovery:  - Cough and breathe deeply. This helps toclear and expand your lungs and can prevent pneumonia.  - Do mild physical activity. Walking or moving your legs help your circulation and body functions return to normal. A staff member will help you when you try to walk and will provide you with simple exercises. Do not try to get up or walk alone the first time. - Actively manage your pain. Managing your pain lets you move in comfort. We will ask you to rate your pain on a scale of zero to 10. It is your responsibility to tell your doctor or nurse where and how much you hurt so your pain can be treated.  Special Considerations -If you are diabetic, you may be placed on insulin after surgery to have closer control over your blood sugars to promote healing and recovery.  This does not mean that you will be discharged on insulin.  If applicable, your oral antidiabetics will be resumed when you are tolerating a solid diet.  -Your final pathology results from surgery should be available around one week after surgery and the results will be relayed to you when available.  -Dr. Lahoma Crocker is the surgeon that assists your GYN Oncologist with surgery.  If you end up staying the night, the next day after your  surgery you will either see Dr. Denman George or Dr. Lahoma Crocker.  -FMLA forms can be faxed to [phone] and please allow 5-7 business days for completion.  Pain Management After Surgery -You have been prescribed your pain medication and bowel regimen medications before surgery so that you can have these available when you are discharged from the hospital. The pain medication is for use ONLY AFTER surgery and a new prescription will not be given.   -Make sure that you have Tylenol and Ibuprofen at home to use on  a regular basis after surgery for pain control. We recommend alternating the medications every hour to six hours since they work differently and are processed in the body differently for pain relief.  -Review the attached handout on narcotic use and their risks and side effects.   Bowel Regimen -You have been prescribed Sennakot-S to take nightly to prevent constipation especially if you are taking the narcotic pain medication intermittently.  It is important to prevent constipation and drink adequate amounts of liquids.   Blood Transfusion Information WHAT IS A BLOOD TRANSFUSION? A transfusion is the replacement of blood or some of its parts. Blood is made up of multiple cells which provide different functions.  Red blood cells carry oxygen and are used for blood loss replacement.  White blood cells fight against infection.  Platelets control bleeding.  Plasma helps clot blood.  Other blood products are available for specialized needs, such as hemophilia or other clotting disorders. BEFORE THE TRANSFUSION  Who gives blood for transfusions?   You may be able to donate blood to be used at a later date on yourself (autologous donation).  Relatives can be asked to donate blood. This is generally not any safer than if you have received blood from a stranger. The same precautions are taken to ensure safety when a relative's blood is donated.  Healthy volunteers who are fully evaluated to make sure their blood is safe. This is blood bank blood. Transfusion therapy is the safest it has ever been in the practice of medicine. Before blood is taken from a donor, a complete history is taken to make sure that person has no history of diseases nor engages in risky social behavior (examples are intravenous drug use or sexual activity with multiple partners). The donor's travel history is screened to minimize risk of transmitting infections, such as malaria. The donated blood is tested for signs of  infectious diseases, such as HIV and hepatitis. The blood is then tested to be sure it is compatible with you in order to minimize the chance of a transfusion reaction. If you or a relative donates blood, this is often done in anticipation of surgery and is not appropriate for emergency situations. It takes many days to process the donated blood. RISKS AND COMPLICATIONS Although transfusion therapy is very safe and saves many lives, the main dangers of transfusion include:   Getting an infectious disease.  Developing a transfusion reaction. This is an allergic reaction to something in the blood you were given. Every precaution is taken to prevent this. The decision to have a blood transfusion has been considered carefully by your caregiver before blood is given. Blood is not given unless the benefits outweigh the risks.  AFTER SURGERY CONSIDERATIONS  05/02/2019  Return to work: 4-6 weeks if applicable  Activity: 1. Be up and out of the bed during the day.  Take a nap if needed.  You may walk up steps but be careful and use the  hand rail.  Stair climbing will tire you more than you think, you may need to stop part way and rest.   2. No lifting or straining for 6 weeks.  3. No driving for 1 week(s).  Do not drive if you are taking narcotic pain medicine.  4. Shower daily.  Use soap and water on your incision and pat dry; don't rub.  No tub baths until cleared by your surgeon.   5. No sexual activity and nothing in the vagina for 8 weeks if you have a hysterectomy.  6. You may experience a small amount of clear drainage from your incisions, which is normal.  If the drainage persists or increases, please call the office.  7. If you have a hysterectomy- You may experience vaginal spotting after surgery or around the 6-8 week mark from surgery when the stitches at the top of the vagina begin to dissolve.  The spotting is normal but if you experience heavy bleeding, call our office.  8. Take  Tylenol or ibuprofen first for pain and only use narcotic pain medication for severe pain not relieved by the Tylenol or Ibuprofen.  Monitor your Tylenol intake to a max of 4,000 mg a day.  Diet: 1. Low sodium Heart Healthy Diet is recommended.  2. It is safe to use a laxative, such as Miralax or Colace, if you have difficulty moving your bowels. You can take Sennakot at bedtime every evening to keep bowel movements regular and to prevent constipation.    Wound Care: 1. Keep clean and dry.  Shower daily.  Reasons to call the Doctor:  Fever - Oral temperature greater than 100.4 degrees Fahrenheit  Foul-smelling vaginal discharge  Difficulty urinating  Nausea and vomiting  Increased pain at the site of the incision that is unrelieved with pain medicine.  Difficulty breathing with or without chest pain  New calf pain especially if only on one side  Sudden, continuing increased vaginal bleeding with or without clots.   Contacts: For questions or concerns you should contact:  Dr. Everitt Amber at [phone]  Joylene John, NP at [phone]  After Hours: call [phone] and have the GYN Oncologist paged/contacted

## 2019-05-02 NOTE — Progress Notes (Signed)
Consult Note: Gyn-Onc  Consult was requested by Dr. Royston Sinner for the evaluation of Stacy [name] 38 y.o. female  CC:  Chief Complaint  Patient presents with  . Adnexal mass    Assessment/Plan:  Stacy [name]  is a 38 y.o.  year old abnormal uterine bleeding and bilateral ovarian cystic masses and uterine fibroids.  I discussed with Stacy [name] that I am optimistic that she may not have malignancy given her young age, her normal tumor marker, and the mostly cystic appearance of her masses on ultrasound.  Because of this I think it is reasonable to consider fertility sparing options for her for surgery which she desires.  We will obtain a CT scan of the abdomen and pelvis to better characterize these masses given her obesity and the large size of the masses, and ultrasound and pelvic examination are limited ability to discriminate possible signs of malignancy.  We will follow-up the results of today's biopsy.  If it is benign in the endometrium then I do not have a good explanation for her bleeding other than the potential impact of submucosal fibroids.  I discussed with the patient that I do not perform uterine sparing resection of fibroids at hysteroscopic [facility] or laparoscopically.  I explained that the only treatment I offer for bleeding fibroids is hysterectomy.  Therefore I would not be able to accomplish a procedure for her fibroids and bleeding during the planned procedure.  She is understanding of this.  I am recommending bilateral ovarian cystectomy with frozen section.  If pathology reveals malignancy we would perform completion hysterectomy and BSO at the patient's request in addition to staging procedures.  If benign pathology is identified of surgery will be limited to the ovarian cystectomies.  I explained surgical risks of the procedure including  bleeding, infection, damage to internal organs (such as bladder,ureters, bowels), blood clot, reoperation and  rehospitalization.  I explained anticipated recovery and postoperative expectations.   HPI: Stacy [name] is a 38 year old P1 who is seen in consultation at the request of Dr Royston Sinner for evaluation of bilateral ovarian cysts and abnormal uterine bleeding.  The patient reported having abnormal menses with 3-week long periods for approximately 1 year.  She underwent an ultrasound scan to evaluate this on February 26, 2019 revealed a 5.6 x 6.2 x 6.7 cm uterus with multiple echogenic and shadowing masses.  These were within the myometrium consistent with fibroids.  The endometrium was 5.1 mm thick with no focal abnormality.  The right ovary was not discretely visualized and contain complex cystic mass in the right adnexa potentially containing some soft tissue component.  It measured approximately 8.2 x 7.5 x 6.4 cm.  The left ovary contained a complex cystic mass in the left adnexa measuring 7.8 x 6.1 x 5.9 cm with questionable continuity with the right adnexal abnormality.  Ca1 25 was drawn and was normal at 22.1 and CEA was normal at 1.1.  She was counseled and recommended to undergo surgical excision but felt best to be performed by GYN oncologist given the concern for potential occult malignancy  Patient otherwise reported history of 1 prior vaginal birth.  She is morbidly obese with a BMI of 40 kg/m.  She is not had prior abdominal surgeries.  The patient had a Pap test on April 09, 2019 that was normal and positive for HPV.  She had a history of a Gardasil vaccine.  Current Meds:  Outpatient Encounter Medications as of 05/02/2019  Medication  Sig  . Ferrous Sulfate (SLOW FE PO) Take by mouth.  Marland Kitchen ibuprofen (ADVIL) 800 MG tablet Take 1 tablet (800 mg total) by mouth every 8 (eight) hours as needed for moderate pain. For AFTER surgery only  . metroNIDAZOLE (METROGEL VAGINAL) 0.75 % vaginal gel Place 1 Applicatorful vaginally 2 (two) times daily. (Patient not taking: Reported on 02/13/2019)  .  senna-docusate (SENOKOT-S) 8.6-50 MG tablet Take 2 tablets by mouth at bedtime. For AFTER surgery, do not take if having diarrhea  . tinidazole (TINDAMAX) 500 MG tablet Take 2 tablets (1,000 mg total) by mouth daily with breakfast. (Patient not taking: Reported on 02/13/2019)  . traMADol (ULTRAM) 50 MG tablet Take 1 tablet (50 mg total) by mouth every 6 (six) hours as needed for severe pain. For AFTER surgery only, do not take and drive   No facility-administered encounter medications on file as of 05/02/2019.     Allergy: No Known Allergies  Social Hx:   Social History   Socioeconomic History  . Marital status: Single    Spouse name: Not on file  . Number of children: 1  . Years of education: Not on file  . Highest education level: Not on file  Occupational History  . Occupation: Product manager: [employer]  Social Needs  . Financial resource strain: Not on file  . Food insecurity    Worry: Not on file    Inability: Not on file  . Transportation needs    Medical: Not on file    Non-medical: Not on file  Tobacco Use  . Smoking status: Never Smoker  . Smokeless tobacco: Never Used  Substance and Sexual Activity  . Alcohol use: Yes    Alcohol/week: 2.0 standard drinks    Types: 2 Glasses of wine per week    Comment: wine  . Drug use: No  . Sexual activity: Yes    Partners: Male    Birth control/protection: Condom, None  Lifestyle  . Physical activity    Days per week: Not on file    Minutes per session: Not on file  . Stress: Not on file  Relationships  . Social Herbalist on phone: Not on file    Gets together: Not on file    Attends religious service: Not on file    Active member of club or organization: Not on file    Attends meetings of clubs or organizations: Not on file    Relationship status: Not on file  . Intimate partner violence    Fear of current or ex partner: Not on file    Emotionally abused: Not on file    Physically  abused: Not on file    Forced sexual activity: Not on file  Other Topics Concern  . Not on file  Social History Narrative  . Not on file    Past Surgical Hx:  Past Surgical History:  Procedure Laterality Date  . WISDOM TOOTH EXTRACTION      Past Medical Hx:  Past Medical History:  Diagnosis Date  . Abnormal Papanicolaou smear of cervix   . Abnormal uterine bleeding 04/09/2019  . Anemia   . Fibroids   . Leiomyoma of uterus 04/21/2019  . Mass of left ovary 04/09/2019  . Pap smear abnormality of cervix/human papillomavirus (HPV) positive   . Skin tags, multiple acquired   . Thrombocytosis ([place])     Past Gynecological History:  See HPI No LMP recorded.  Family Hx:  Family History  Problem Relation Age of Onset  . Cancer Maternal Grandmother        lung    Review of Systems:  Constitutional  Feels well,    ENT Normal appearing ears and nares bilaterally Skin/Breast  No rash, sores, jaundice, itching, dryness Cardiovascular  No chest pain, shortness of breath, or edema  Pulmonary  No cough or wheeze.  Gastro Intestinal  No nausea, vomitting, or diarrhoea. No bright red blood per rectum, no abdominal pain, change in bowel movement, or constipation.  Genito Urinary  No frequency, urgency, dysuria, + abnormal uterine bleeding. Musculo Skeletal  No myalgia, arthralgia, joint swelling or pain  Neurologic  No weakness, numbness, change in gait,  Psychology  No depression, anxiety, insomnia.   Vitals:  Blood pressure 135/88, pulse 79, temperature 98.2 F (36.8 C), temperature source Temporal, resp. rate 18, height 5' 6.5" (1.689 m), weight 257 lb (116.6 kg), SpO2 100 %.  Physical Exam: WD in NAD Neck  Supple NROM, without any enlargements.  Lymph Node Survey No cervical supraclavicular or inguinal adenopathy Cardiovascular  Pulse normal rate, regularity and rhythm. S1 and S2 normal.  Lungs  Clear to auscultation bilateraly, without wheezes/crackles/rhonchi.  Good air movement.  Skin  No rash/lesions/breakdown  Psychiatry  Alert and oriented to person, place, and time  Abdomen  Normoactive bowel sounds, abdomen soft, non-tender and obese without evidence of hernia.  Back No CVA tenderness Genito Urinary  Vulva/vagina: Normal external female genitalia.   No lesions. No discharge or bleeding.  Bladder/urethra:  No lesions or masses, well supported bladder  Vagina: normal  Cervix: Normal appearing, no lesions.  Uterus: Slightly bulky, mobile, no parametrial involvement or nodularity.  Adnexa:fullness in the adnexa but no discrete masses. Rectal  deferred Extremities  No bilateral cyanosis, clubbing or edema.  Procedure Note:  Preop Dx: abnormal uterine bleeding Postop Dx: same Procedure: endometrial biopsy Surgeon: Dorann Ou, MD EBL: 10cc Specimens: endometrium Complications: none Procedure Details: The procedure was explained to the patient she provided verbal consent and a verbal timeout was performed.  The speculum was inserted to the vagina and the cervix was visualized.  An endometrial Pipelle was placed passed into the cervix to the level of the fundus after grasping the cervix with a tenaculum.  It was aspirated and a small amount of endometrial tissue was retrieved.  There was brisk bleeding from the tenaculum sites and this was made hemostatic with silver nitrate and Monsel solution.  The patient tolerated the procedure well and the specimen sent for permanent histopathology.    Thereasa Solo, MD  05/02/2019, 6:05 PM

## 2019-05-05 ENCOUNTER — Telehealth: Payer: Self-pay

## 2019-05-05 LAB — SURGICAL PATHOLOGY

## 2019-05-05 NOTE — Telephone Encounter (Signed)
I spoke with Ms Stacy [name] regarding her endometrial biopsy results. I informed her there was no evidence of cancer or precancer.  She verbalized understanding.

## 2019-05-09 ENCOUNTER — Other Ambulatory Visit: Payer: Self-pay

## 2019-05-09 ENCOUNTER — Encounter (HOSPITAL_COMMUNITY): Payer: Self-pay

## 2019-05-09 ENCOUNTER — Ambulatory Visit (HOSPITAL_COMMUNITY)
Admission: RE | Admit: 2019-05-09 | Discharge: 2019-05-09 | Disposition: A | Discharge status: Home / Self Care | Payer: BC Managed Care – PPO | Source: Ambulatory Visit | Attending: Gynecologic Oncology | Admitting: Gynecologic Oncology

## 2019-05-09 DIAGNOSIS — N9489 Other specified conditions associated with female genital organs and menstrual cycle: Secondary | ICD-10-CM | POA: Diagnosis present

## 2019-05-09 MED ORDER — SODIUM CHLORIDE (PF) 0.9 % IJ SOLN
INTRAMUSCULAR | Status: AC
  Filled 2019-05-09: qty 50

## 2019-05-09 MED ORDER — IOHEXOL 300 MG/ML  SOLN
100.0000 mL | Freq: Once | INTRAMUSCULAR | Status: AC | PRN
  Administered 2019-05-09: 09:00:00 100 mL via INTRAVENOUS

## 2019-05-12 ENCOUNTER — Telehealth: Payer: Self-pay

## 2019-05-12 NOTE — Telephone Encounter (Signed)
Told Ms Lewallen that the CT shows the bilateral ovarian masses that are known.  There is no strong evidence of malignancy on scan per Joylene John, NP. Pt verbalized understanding.

## 2019-05-15 ENCOUNTER — Telehealth: Payer: Self-pay | Admitting: *Deleted

## 2019-05-15 NOTE — Telephone Encounter (Signed)
Patient called and requested to talk with Lenna Sciara APP "I wanted to speak with Lenna Sciara regarding my letter for work." Message forwarded to [place] APP

## 2019-05-16 ENCOUNTER — Encounter: Payer: Self-pay | Admitting: Gynecologic Oncology

## 2019-05-22 ENCOUNTER — Telehealth: Payer: Self-pay | Admitting: *Deleted

## 2019-05-22 NOTE — Telephone Encounter (Signed)
Attempted to call the patient to check on her FMLA, unable to leave message. Mailbox is full

## 2019-05-29 NOTE — Patient Instructions (Addendum)
DUE TO COVID-19 ONLY ONE VISITOR IS ALLOWED TO COME WITH YOU AND STAY IN THE WAITING ROOM ONLY DURING PRE OP AND PROCEDURE DAY OF SURGERY. THE 1 VISITOR MAY VISIT WITH YOU AFTER SURGERY IN YOUR PRIVATE ROOM DURING VISITING HOURS ONLY!   YOUR COVID TEST IS COMPLETED, PLEASE BEGIN THE QUARANTINE INSTRUCTIONS AS OUTLINED IN YOUR HANDOUT.                [place]     Your procedure is scheduled on: 06-03-19    Report to [facility] Main  Entrance     Report to Admitting at 11:30 AM     Call this number if you have problems the morning of surgery [phone]     Remember: Eat a light diet the day before surgery.  Examples including soups, broths, toast, yogurt, mashed potatoes.  Things to avoid include carbonated beverages (fizzy beverages), raw fruits and raw vegetables, or beans. If your bowels are filled with gas, your surgeon will have difficulty visualizing your pelvic organs which increases your surgical risks.   AFTER MIDNIGHT, YOU MAY HAVE A CLEAR LIQUID DIET FROM MIDNIGHT UNTIL 10:30 AM THE DAY OF SURGERY. AFTER 10:30 AM, NOTHING BY MOUTH  UNTIL AFTER SURGERY.   CLEAR LIQUID DIET   Foods Allowed                                                                     Foods Excluded  Coffee and tea, regular and decaf                             liquids that you cannot  Plain Jell-O any favor except red or purple                                           see through such as: Fruit ices (not with fruit pulp)                                     milk, soups, orange juice  Iced Popsicles                                    All solid food Carbonated beverages, regular and diet                                    Cranberry, grape and apple juices Sports drinks like Gatorade Lightly seasoned clear broth or consume(fat free) Sugar, honey syrup  Sample Menu Breakfast                                Lunch  Supper Cranberry juice                     Beef broth                            Chicken broth Jell-O                                     Grape juice                           Apple juice Coffee or tea                        Jell-O                                      Popsicle                                                Coffee or tea                        Coffee or tea  _____________________________________________________________________       Take these medicines the morning of surgery with A SIP OF WATER: NONE   BRUSH YOUR TEETH MORNING OF SURGERY AND RINSE YOUR MOUTH OUT, NO CHEWING GUM CANDY OR MINTS.                                 You may not have any metal on your body including hair pins and              piercings    Do not wear jewelry, make-up, lotions, powders or perfumes, deodorant              Do not wear nail polish on your fingernails.  Do not shave  48 hours prior to surgery.     Do not bring valuables to the hospital. [place].  Contacts, dentures or bridgework may not be worn into surgery.      Patients discharged the day of surgery will not be allowed to drive home. IF YOU ARE HAVING SURGERY AND GOING HOME THE SAME DAY, YOU MUST HAVE AN ADULT TO DRIVE YOU HOME AND BE WITH YOU FOR 24 HOURS. YOU MAY GO HOME BY TAXI OR UBER OR ORTHERWISE, BUT AN ADULT MUST ACCOMPANY YOU HOME AND STAY WITH YOU FOR 24 HOURS.  Name and phone number of your driver:  Special Instructions: N/A               Please read over the following fact sheets you were given: _____________________________________________________________________             [facility] - Preparing for Surgery Before surgery, you can play an important role.  Because skin is not sterile, your skin needs to be as free of germs as possible.  You can  reduce the number of germs on your skin by washing with CHG (chlorahexidine gluconate) soap before surgery.  CHG is an antiseptic cleaner which  kills germs and bonds with the skin to continue killing germs even after washing. Please DO NOT use if you have an allergy to CHG or antibacterial soaps.  If your skin becomes reddened/irritated stop using the CHG and inform your nurse when you arrive at Short Stay. Do not shave (including legs and underarms) for at least 48 hours prior to the first CHG shower.  You may shave your face/neck. Please follow these instructions carefully:  1.  Shower with CHG Soap the night before surgery and the  morning of Surgery.  2.  If you choose to wash your hair, wash your hair first as usual with your  normal  shampoo.  3.  After you shampoo, rinse your hair and body thoroughly to remove the  shampoo.                           4.  Use CHG as you would any other liquid soap.  You can apply chg directly  to the skin and wash                       Gently with a scrungie or clean washcloth.  5.  Apply the CHG Soap to your body ONLY FROM THE NECK DOWN.   Do not use on face/ open                           Wound or open sores. Avoid contact with eyes, ears mouth and genitals (private parts).                       Wash face,  Genitals (private parts) with your normal soap.             6.  Wash thoroughly, paying special attention to the area where your surgery  will be performed.  7.  Thoroughly rinse your body with warm water from the neck down.  8.  DO NOT shower/wash with your normal soap after using and rinsing off  the CHG Soap.                9.  Pat yourself dry with a clean towel.            10.  Wear clean pajamas.            11.  Place clean sheets on your bed the night of your first shower and do not  sleep with pets. Day of Surgery : Do not apply any lotions/deodorants the morning of surgery.  Please wear clean clothes to the hospital/surgery center.  FAILURE TO FOLLOW THESE INSTRUCTIONS MAY RESULT IN THE CANCELLATION OF YOUR SURGERY PATIENT SIGNATURE_________________________________  NURSE  SIGNATURE__________________________________  ________________________________________________________________________  WHAT IS A BLOOD TRANSFUSION? Blood Transfusion Information  A transfusion is the replacement of blood or some of its parts. Blood is made up of multiple cells which provide different functions.  Red blood cells carry oxygen and are used for blood loss replacement.  White blood cells fight against infection.  Platelets control bleeding.  Plasma helps clot blood.  Other blood products are available for specialized needs, such as hemophilia or other clotting disorders. BEFORE THE TRANSFUSION  Who gives blood for transfusions?   Healthy volunteers  who are fully evaluated to make sure their blood is safe. This is blood bank blood. Transfusion therapy is the safest it has ever been in the practice of medicine. Before blood is taken from a donor, a complete history is taken to make sure that person has no history of diseases nor engages in risky social behavior (examples are intravenous drug use or sexual activity with multiple partners). The donor's travel history is screened to minimize risk of transmitting infections, such as malaria. The donated blood is tested for signs of infectious diseases, such as HIV and hepatitis. The blood is then tested to be sure it is compatible with you in order to minimize the chance of a transfusion reaction. If you or a relative donates blood, this is often done in anticipation of surgery and is not appropriate for emergency situations. It takes many days to process the donated blood. RISKS AND COMPLICATIONS Although transfusion therapy is very safe and saves many lives, the main dangers of transfusion include:   Getting an infectious disease.  Developing a transfusion reaction. This is an allergic reaction to something in the blood you were given. Every precaution is taken to prevent this. The decision to have a blood transfusion has been  considered carefully by your caregiver before blood is given. Blood is not given unless the benefits outweigh the risks. AFTER THE TRANSFUSION  Right after receiving a blood transfusion, you will usually feel much better and more energetic. This is especially true if your red blood cells have gotten low (anemic). The transfusion raises the level of the red blood cells which carry oxygen, and this usually causes an energy increase.  The nurse administering the transfusion will monitor you carefully for complications. HOME CARE INSTRUCTIONS  No special instructions are needed after a transfusion. You may find your energy is better. Speak with your caregiver about any limitations on activity for underlying diseases you may have. SEEK MEDICAL CARE IF:   Your condition is not improving after your transfusion.  You develop redness or irritation at the intravenous (IV) site. SEEK IMMEDIATE MEDICAL CARE IF:  Any of the following symptoms occur over the next 12 hours:  Shaking chills.  You have a temperature by mouth above 102 F (38.9 C), not controlled by medicine.  Chest, back, or muscle pain.  People around you feel you are not acting correctly or are confused.  Shortness of breath or difficulty breathing.  Dizziness and fainting.  You get a rash or develop hives.  You have a decrease in urine output.  Your urine turns a dark color or changes to pink, red, or brown. Any of the following symptoms occur over the next 10 days:  You have a temperature by mouth above 102 F (38.9 C), not controlled by medicine.  Shortness of breath.  Weakness after normal activity.  The white part of the eye turns yellow (jaundice).  You have a decrease in the amount of urine or are urinating less often.  Your urine turns a dark color or changes to pink, red, or brown. Document Released: 07/14/2000 Document Revised: 10/09/2011 Document Reviewed: 03/02/2008 [facility] Patient Information 2014  [place], [place].  _______________________________________________________________________

## 2019-05-30 ENCOUNTER — Other Ambulatory Visit: Payer: Self-pay

## 2019-05-30 ENCOUNTER — Encounter (HOSPITAL_COMMUNITY): Payer: Self-pay

## 2019-05-30 ENCOUNTER — Encounter (HOSPITAL_COMMUNITY)
Admission: RE | Admit: 2019-05-30 | Discharge: 2019-05-30 | Disposition: A | Discharge status: Home / Self Care | Payer: BC Managed Care – PPO | Source: Ambulatory Visit | Attending: Gynecologic Oncology | Admitting: Gynecologic Oncology

## 2019-05-30 ENCOUNTER — Other Ambulatory Visit (HOSPITAL_COMMUNITY)
Admission: RE | Admit: 2019-05-30 | Discharge: 2019-05-30 | Disposition: A | Discharge status: Home / Self Care | Payer: BC Managed Care – PPO | Source: Ambulatory Visit | Attending: Gynecologic Oncology | Admitting: Gynecologic Oncology

## 2019-05-30 DIAGNOSIS — N83202 Unspecified ovarian cyst, left side: Secondary | ICD-10-CM | POA: Diagnosis not present

## 2019-05-30 DIAGNOSIS — N83201 Unspecified ovarian cyst, right side: Secondary | ICD-10-CM | POA: Insufficient documentation

## 2019-05-30 DIAGNOSIS — Z01812 Encounter for preprocedural laboratory examination: Secondary | ICD-10-CM | POA: Insufficient documentation

## 2019-05-30 DIAGNOSIS — Z20828 Contact with and (suspected) exposure to other viral communicable diseases: Secondary | ICD-10-CM | POA: Diagnosis not present

## 2019-05-30 HISTORY — DX: Headache, unspecified: R51.9

## 2019-05-30 LAB — URINALYSIS, ROUTINE W REFLEX MICROSCOPIC
Bilirubin Urine: NEGATIVE
Glucose, UA: NEGATIVE mg/dL
Ketones, ur: NEGATIVE mg/dL
Nitrite: NEGATIVE
Protein, ur: NEGATIVE mg/dL
Specific Gravity, Urine: 1.011 (ref 1.005–1.030)
pH: 7 (ref 5.0–8.0)

## 2019-05-30 LAB — COMPREHENSIVE METABOLIC PANEL
ALT: 15 U/L (ref 0–44)
AST: 15 U/L (ref 15–41)
Albumin: 4 g/dL (ref 3.5–5.0)
Alkaline Phosphatase: 84 U/L (ref 38–126)
Anion gap: 5 (ref 5–15)
BUN: 10 mg/dL (ref 6–20)
CO2: 25 mmol/L (ref 22–32)
Calcium: 8.8 mg/dL — ABNORMAL LOW (ref 8.9–10.3)
Chloride: 108 mmol/L (ref 98–111)
Creatinine, Ser: 0.81 mg/dL (ref 0.44–1.00)
GFR calc Af Amer: >60 mL/min (ref >60)
GFR calc non Af Amer: >60 mL/min (ref >60)
Glucose, Bld: 85 mg/dL (ref 70–99)
Potassium: 3.9 mmol/L (ref 3.5–5.1)
Sodium: 138 mmol/L (ref 135–145)
Total Bilirubin: 0.5 mg/dL (ref 0.3–1.2)
Total Protein: 7.7 g/dL (ref 6.5–8.1)

## 2019-05-30 LAB — CBC
HCT: 36 % (ref 36.0–46.0)
Hemoglobin: 10.9 g/dL — ABNORMAL LOW (ref 12.0–15.0)
MCH: 27.6 pg (ref 26.0–34.0)
MCHC: 30.3 g/dL (ref 30.0–36.0)
MCV: 91.1 fL (ref 80.0–100.0)
Platelets: 452 10*3/uL — ABNORMAL HIGH (ref 150–400)
RBC: 3.95 MIL/uL (ref 3.87–5.11)
RDW: 13 % (ref 11.5–15.5)
WBC: 7.6 10*3/uL (ref 4.0–10.5)
nRBC: 0 % (ref 0.0–0.2)

## 2019-05-30 LAB — ABO/RH: ABO/RH(D): O POS

## 2019-05-30 NOTE — Progress Notes (Signed)
PCP - none Cardiologist -   Chest x-ray -  EKG -  Stress Test -  ECHO -  Cardiac Cath -   Sleep Study -  CPAP -   Fasting Blood Sugar -  Checks Blood Sugar _____ times a day  Blood Thinner Instructions: Aspirin Instructions: Last Dose:  Anesthesia review:   Patient had face to face consult with APP at pre-op as patient is concerned her larger breast size and the position her surgeon informed her she will be in for surgery may pose issue during surgery with her breathing. APP provided reassurance and explanation of postioning and priority of airway during surgery . Patient verbalized satisfaction with the explanation received .   Patient denies shortness of breath, fever, cough and chest pain at PAT appointment   Patient verbalized understanding of instructions that were given to them at the PAT appointment. Patient was also instructed that they will need to review over the PAT instructions again at home before surgery.

## 2019-05-31 LAB — NOVEL CORONAVIRUS, NAA (HOSP ORDER, SEND-OUT TO REF LAB; TAT 18-24 HRS): SARS-CoV-2, NAA: NOT DETECTED

## 2019-06-02 ENCOUNTER — Telehealth: Payer: Self-pay

## 2019-06-02 NOTE — Telephone Encounter (Signed)
Stacy [name] understands her pre op instructions. She inquired as to how long her stay may be tomorrow in the hospital. Told her it may be ~ 6hrs.  The time  depends on the extent of the surgery and how quickly she recovers. Stacy [name] verbalized understanding.

## 2019-06-03 ENCOUNTER — Encounter (HOSPITAL_COMMUNITY)
Admission: RE | Disposition: A | Discharge status: Home / Self Care | Payer: Self-pay | Source: Home / Self Care | Attending: Gynecologic Oncology

## 2019-06-03 ENCOUNTER — Ambulatory Visit (HOSPITAL_COMMUNITY)
Admission: RE | Admit: 2019-06-03 | Discharge: 2019-06-03 | Disposition: A | Discharge status: Home / Self Care | Payer: BC Managed Care – PPO | Attending: Gynecologic Oncology | Admitting: Gynecologic Oncology

## 2019-06-03 ENCOUNTER — Encounter (HOSPITAL_COMMUNITY): Payer: Self-pay | Admitting: *Deleted

## 2019-06-03 ENCOUNTER — Ambulatory Visit (HOSPITAL_COMMUNITY): Payer: BC Managed Care – PPO | Admitting: Physician Assistant

## 2019-06-03 ENCOUNTER — Ambulatory Visit (HOSPITAL_COMMUNITY): Payer: BC Managed Care – PPO | Admitting: Certified Registered Nurse Anesthetist

## 2019-06-03 ENCOUNTER — Other Ambulatory Visit: Payer: Self-pay

## 2019-06-03 DIAGNOSIS — N800 Endometriosis of uterus: Secondary | ICD-10-CM

## 2019-06-03 DIAGNOSIS — N939 Abnormal uterine and vaginal bleeding, unspecified: Secondary | ICD-10-CM | POA: Insufficient documentation

## 2019-06-03 DIAGNOSIS — D259 Leiomyoma of uterus, unspecified: Secondary | ICD-10-CM | POA: Diagnosis not present

## 2019-06-03 DIAGNOSIS — N802 Endometriosis of fallopian tube: Secondary | ICD-10-CM | POA: Insufficient documentation

## 2019-06-03 DIAGNOSIS — N9489 Other specified conditions associated with female genital organs and menstrual cycle: Secondary | ICD-10-CM

## 2019-06-03 DIAGNOSIS — N801 Endometriosis of ovary: Secondary | ICD-10-CM | POA: Diagnosis not present

## 2019-06-03 DIAGNOSIS — Z6841 Body Mass Index (BMI) 40.0 and over, adult: Secondary | ICD-10-CM | POA: Insufficient documentation

## 2019-06-03 DIAGNOSIS — N83202 Unspecified ovarian cyst, left side: Secondary | ICD-10-CM

## 2019-06-03 DIAGNOSIS — N83201 Unspecified ovarian cyst, right side: Secondary | ICD-10-CM | POA: Diagnosis not present

## 2019-06-03 DIAGNOSIS — N80102 Endometriosis of left ovary, unspecified depth: Secondary | ICD-10-CM

## 2019-06-03 HISTORY — PX: ROBOTIC ASSISTED LAPAROSCOPIC OVARIAN CYSTECTOMY: SHX6081

## 2019-06-03 LAB — TYPE AND SCREEN
ABO/RH(D): O POS
Antibody Screen: NEGATIVE

## 2019-06-03 LAB — PREGNANCY, URINE: Preg Test, Ur: NEGATIVE

## 2019-06-03 SURGERY — EXCISION, CYST, OVARY, ROBOT-ASSISTED, LAPAROSCOPIC
Anesthesia: General | Laterality: Bilateral

## 2019-06-03 MED ORDER — SODIUM CHLORIDE 0.9% FLUSH: 3.0000 mL | Freq: Two times a day (BID) | INTRAVENOUS | Status: DC

## 2019-06-03 MED ORDER — LACTATED RINGERS IR SOLN
Status: DC | PRN
  Administered 2019-06-03: 1

## 2019-06-03 MED ORDER — CEFAZOLIN SODIUM-DEXTROSE 2-4 GM/100ML-% IV SOLN
2.0000 g | INTRAVENOUS | Status: AC
  Administered 2019-06-03: 14:00:00 2 g via INTRAVENOUS
  Filled 2019-06-03: qty 100

## 2019-06-03 MED ORDER — HYDROMORPHONE HCL 1 MG/ML IJ SOLN: 0.2500 mg | INTRAMUSCULAR | Status: DC | PRN

## 2019-06-03 MED ORDER — GABAPENTIN 300 MG PO CAPS
300.0000 mg | ORAL_CAPSULE | ORAL | Status: AC
  Administered 2019-06-03: 12:00:00 300 mg via ORAL
  Filled 2019-06-03: qty 1

## 2019-06-03 MED ORDER — PROMETHAZINE HCL 25 MG/ML IJ SOLN
6.2500 mg | Freq: Once | INTRAMUSCULAR | Status: AC
  Administered 2019-06-03: 17:00:00 6.25 mg via INTRAVENOUS

## 2019-06-03 MED ORDER — MORPHINE SULFATE (PF) 4 MG/ML IV SOLN: 2.0000 mg | INTRAVENOUS | Status: DC | PRN

## 2019-06-03 MED ORDER — ACETAMINOPHEN 325 MG PO TABS: 650.0000 mg | ORAL_TABLET | ORAL | Status: DC | PRN

## 2019-06-03 MED ORDER — SCOPOLAMINE 1 MG/3DAYS TD PT72
1.0000 | MEDICATED_PATCH | TRANSDERMAL | Status: DC
  Administered 2019-06-03: 12:00:00 1.5 mg via TRANSDERMAL
  Filled 2019-06-03: qty 1

## 2019-06-03 MED ORDER — MIDAZOLAM HCL 2 MG/2ML IJ SOLN
INTRAMUSCULAR | Status: AC
  Filled 2019-06-03: qty 2

## 2019-06-03 MED ORDER — ROCURONIUM BROMIDE 10 MG/ML (PF) SYRINGE
PREFILLED_SYRINGE | INTRAVENOUS | Status: DC | PRN
  Administered 2019-06-03 (×2): 10 mg via INTRAVENOUS
  Administered 2019-06-03: 20 mg via INTRAVENOUS
  Administered 2019-06-03: 50 mg via INTRAVENOUS

## 2019-06-03 MED ORDER — ENOXAPARIN SODIUM 40 MG/0.4ML ~~LOC~~ SOLN
40.0000 mg | SUBCUTANEOUS | Status: AC
  Administered 2019-06-03: 12:00:00 40 mg via SUBCUTANEOUS
  Filled 2019-06-03: qty 0.4

## 2019-06-03 MED ORDER — BUPIVACAINE HCL 0.25 % IJ SOLN
INTRAMUSCULAR | Status: DC | PRN
  Administered 2019-06-03: 15 mL

## 2019-06-03 MED ORDER — SODIUM CHLORIDE 0.9% FLUSH: 3.0000 mL | INTRAVENOUS | Status: DC | PRN

## 2019-06-03 MED ORDER — PROPOFOL 10 MG/ML IV BOLUS
INTRAVENOUS | Status: DC | PRN
  Administered 2019-06-03: 200 mg via INTRAVENOUS

## 2019-06-03 MED ORDER — DEXAMETHASONE SODIUM PHOSPHATE 4 MG/ML IJ SOLN: 4.0000 mg | INTRAMUSCULAR | Status: DC

## 2019-06-03 MED ORDER — LIDOCAINE 2% (20 MG/ML) 5 ML SYRINGE
INTRAMUSCULAR | Status: DC | PRN
  Administered 2019-06-03: 100 mg via INTRAVENOUS

## 2019-06-03 MED ORDER — CELECOXIB 200 MG PO CAPS
400.0000 mg | ORAL_CAPSULE | ORAL | Status: AC
  Administered 2019-06-03: 12:00:00 400 mg via ORAL
  Filled 2019-06-03: qty 2

## 2019-06-03 MED ORDER — FENTANYL CITRATE (PF) 100 MCG/2ML IJ SOLN
INTRAMUSCULAR | Status: AC
  Administered 2019-06-03: 50 ug via INTRAVENOUS
  Filled 2019-06-03: qty 2

## 2019-06-03 MED ORDER — KETAMINE HCL 10 MG/ML IJ SOLN
INTRAMUSCULAR | Status: DC | PRN
  Administered 2019-06-03: 40 mg via INTRAVENOUS
  Administered 2019-06-03: 10 mg via INTRAVENOUS

## 2019-06-03 MED ORDER — FENTANYL CITRATE (PF) 250 MCG/5ML IJ SOLN
INTRAMUSCULAR | Status: AC
  Filled 2019-06-03: qty 5

## 2019-06-03 MED ORDER — PROPOFOL 10 MG/ML IV BOLUS
INTRAVENOUS | Status: AC
  Filled 2019-06-03: qty 20

## 2019-06-03 MED ORDER — ACETAMINOPHEN 500 MG PO TABS
1000.0000 mg | ORAL_TABLET | ORAL | Status: AC
  Administered 2019-06-03: 12:00:00 1000 mg via ORAL
  Filled 2019-06-03: qty 2

## 2019-06-03 MED ORDER — ONDANSETRON HCL 4 MG/2ML IJ SOLN
INTRAMUSCULAR | Status: DC | PRN
  Administered 2019-06-03: 4 mg via INTRAVENOUS

## 2019-06-03 MED ORDER — OXYCODONE HCL 5 MG PO TABS: 5.0000 mg | ORAL_TABLET | ORAL | Status: DC | PRN

## 2019-06-03 MED ORDER — DEXAMETHASONE SODIUM PHOSPHATE 10 MG/ML IJ SOLN
INTRAMUSCULAR | Status: DC | PRN
  Administered 2019-06-03: 10 mg via INTRAVENOUS

## 2019-06-03 MED ORDER — SODIUM CHLORIDE 0.9 % IV SOLN: 250.0000 mL | INTRAVENOUS | Status: DC | PRN

## 2019-06-03 MED ORDER — SUCCINYLCHOLINE CHLORIDE 200 MG/10ML IV SOSY
PREFILLED_SYRINGE | INTRAVENOUS | Status: DC | PRN
  Administered 2019-06-03: 200 mg via INTRAVENOUS

## 2019-06-03 MED ORDER — STERILE WATER FOR IRRIGATION IR SOLN
Status: DC | PRN
  Administered 2019-06-03: 1000 mL

## 2019-06-03 MED ORDER — SUGAMMADEX SODIUM 500 MG/5ML IV SOLN
INTRAVENOUS | Status: DC | PRN
  Administered 2019-06-03: 250 mg via INTRAVENOUS

## 2019-06-03 MED ORDER — LIDOCAINE 2% (20 MG/ML) 5 ML SYRINGE
INTRAMUSCULAR | Status: AC
  Filled 2019-06-03: qty 5

## 2019-06-03 MED ORDER — FENTANYL CITRATE (PF) 100 MCG/2ML IJ SOLN
25.0000 ug | INTRAMUSCULAR | Status: DC | PRN
  Administered 2019-06-03: 17:00:00 50 ug via INTRAVENOUS

## 2019-06-03 MED ORDER — ONDANSETRON HCL 4 MG/2ML IJ SOLN
INTRAMUSCULAR | Status: AC
  Filled 2019-06-03: qty 2

## 2019-06-03 MED ORDER — ACETAMINOPHEN 650 MG RE SUPP
650.0000 mg | RECTAL | Status: DC | PRN
  Filled 2019-06-03: qty 1

## 2019-06-03 MED ORDER — MIDAZOLAM HCL 5 MG/5ML IJ SOLN
INTRAMUSCULAR | Status: DC | PRN
  Administered 2019-06-03: 2 mg via INTRAVENOUS

## 2019-06-03 MED ORDER — DEXAMETHASONE SODIUM PHOSPHATE 10 MG/ML IJ SOLN
INTRAMUSCULAR | Status: AC
  Filled 2019-06-03: qty 1

## 2019-06-03 MED ORDER — LACTATED RINGERS IV SOLN
INTRAVENOUS | Status: DC
  Administered 2019-06-03: 1000 mL via INTRAVENOUS
  Administered 2019-06-03 (×2): via INTRAVENOUS

## 2019-06-03 MED ORDER — ROCURONIUM BROMIDE 10 MG/ML (PF) SYRINGE
PREFILLED_SYRINGE | INTRAVENOUS | Status: AC
  Filled 2019-06-03: qty 10

## 2019-06-03 MED ORDER — FENTANYL CITRATE (PF) 250 MCG/5ML IJ SOLN
INTRAMUSCULAR | Status: DC | PRN
  Administered 2019-06-03 (×2): 50 ug via INTRAVENOUS
  Administered 2019-06-03: 100 ug via INTRAVENOUS
  Administered 2019-06-03: 50 ug via INTRAVENOUS

## 2019-06-03 MED ORDER — BUPIVACAINE HCL (PF) 0.25 % IJ SOLN
INTRAMUSCULAR | Status: AC
  Filled 2019-06-03: qty 30

## 2019-06-03 MED ORDER — LIDOCAINE 2% (20 MG/ML) 5 ML SYRINGE
INTRAMUSCULAR | Status: DC | PRN
  Administered 2019-06-03: 1 mg/kg/h via INTRAVENOUS

## 2019-06-03 MED ORDER — KETOROLAC TROMETHAMINE 30 MG/ML IJ SOLN: 30.0000 mg | Freq: Four times a day (QID) | INTRAMUSCULAR | Status: DC

## 2019-06-03 MED ORDER — PROMETHAZINE HCL 25 MG/ML IJ SOLN
INTRAMUSCULAR | Status: AC
  Administered 2019-06-03: 17:00:00 6.25 mg via INTRAVENOUS
  Filled 2019-06-03: qty 1

## 2019-06-03 SURGICAL SUPPLY — 62 items
APPLICATOR SURGIFLO ENDO (HEMOSTASIS) IMPLANT
BAG LAPAROSCOPIC 12 15 PORT 16 (BASKET) IMPLANT
BAG RETRIEVAL 12/15 (BASKET)
BLADE SURG SZ10 CARB STEEL (BLADE) IMPLANT
COVER BACK TABLE 60X90IN (DRAPES) ×2 IMPLANT
COVER TIP SHEARS 8 DVNC (MISCELLANEOUS) ×1 IMPLANT
COVER TIP SHEARS 8MM DA VINCI (MISCELLANEOUS) ×1
COVER WAND RF STERILE (DRAPES) IMPLANT
DECANTER SPIKE VIAL GLASS SM (MISCELLANEOUS) IMPLANT
DERMABOND ADVANCED (GAUZE/BANDAGES/DRESSINGS) ×1
DERMABOND ADVANCED .7 DNX12 (GAUZE/BANDAGES/DRESSINGS) ×1 IMPLANT
DRAPE ARM DVNC X/XI (DISPOSABLE) ×4 IMPLANT
DRAPE COLUMN DVNC XI (DISPOSABLE) ×1 IMPLANT
DRAPE DA VINCI XI ARM (DISPOSABLE) ×4
DRAPE DA VINCI XI COLUMN (DISPOSABLE) ×1
DRAPE SHEET LG 3/4 BI-LAMINATE (DRAPES) ×2 IMPLANT
DRAPE SURG IRRIG POUCH 19X23 (DRAPES) ×2 IMPLANT
DRSG OPSITE POSTOP 4X6 (GAUZE/BANDAGES/DRESSINGS) IMPLANT
DRSG OPSITE POSTOP 4X8 (GAUZE/BANDAGES/DRESSINGS) IMPLANT
ELECT REM PT RETURN 15FT ADLT (MISCELLANEOUS) ×2 IMPLANT
GLOVE BIO SURGEON STRL SZ 6 (GLOVE) ×8 IMPLANT
GLOVE BIO SURGEON STRL SZ 6.5 (GLOVE) IMPLANT
GOWN STRL REUS W/ TWL LRG LVL3 (GOWN DISPOSABLE) ×4 IMPLANT
GOWN STRL REUS W/TWL LRG LVL3 (GOWN DISPOSABLE) ×4
HEMOSTAT SURGICEL 4X8 (HEMOSTASIS) ×2 IMPLANT
HOLDER FOLEY CATH W/STRAP (MISCELLANEOUS) ×2 IMPLANT
IRRIG SUCT STRYKERFLOW 2 WTIP (MISCELLANEOUS) ×2
IRRIGATION SUCT STRKRFLW 2 WTP (MISCELLANEOUS) ×1 IMPLANT
KIT PROCEDURE DA VINCI SI (MISCELLANEOUS)
KIT PROCEDURE DVNC SI (MISCELLANEOUS) IMPLANT
KIT TURNOVER KIT A (KITS) IMPLANT
MANIPULATOR UTERINE 4.5 ZUMI (MISCELLANEOUS) ×2 IMPLANT
NEEDLE HYPO 22GX1.5 SAFETY (NEEDLE) IMPLANT
NEEDLE SPNL 18GX3.5 QUINCKE PK (NEEDLE) IMPLANT
OBTURATOR OPTICAL STANDARD 8MM (TROCAR) ×1
OBTURATOR OPTICAL STND 8 DVNC (TROCAR) ×1
OBTURATOR OPTICALSTD 8 DVNC (TROCAR) ×1 IMPLANT
PACK ROBOT GYN CUSTOM WL (TRAY / TRAY PROCEDURE) ×2 IMPLANT
PAD POSITIONING PINK XL (MISCELLANEOUS) ×2 IMPLANT
PORT ACCESS TROCAR AIRSEAL 12 (TROCAR) ×1 IMPLANT
PORT ACCESS TROCAR AIRSEAL 5M (TROCAR) ×1
POUCH SPECIMEN RETRIEVAL 10MM (ENDOMECHANICALS) ×4 IMPLANT
SEAL CANN UNIV 5-8 DVNC XI (MISCELLANEOUS) ×3 IMPLANT
SEAL XI 5MM-8MM UNIVERSAL (MISCELLANEOUS) ×3
SET TRI-LUMEN FLTR TB AIRSEAL (TUBING) ×2 IMPLANT
SPONGE LAP 18X18 X RAY DECT (DISPOSABLE) IMPLANT
SURGIFLO W/THROMBIN 8M KIT (HEMOSTASIS) IMPLANT
SUT MNCRL AB 4-0 PS2 18 (SUTURE) IMPLANT
SUT PDS AB 1 TP1 96 (SUTURE) IMPLANT
SUT VIC AB 0 CT1 27 (SUTURE)
SUT VIC AB 0 CT1 27XBRD ANTBC (SUTURE) IMPLANT
SUT VIC AB 2-0 CT1 27 (SUTURE)
SUT VIC AB 2-0 CT1 TAPERPNT 27 (SUTURE) IMPLANT
SUT VICRYL 4-0 PS2 18IN ABS (SUTURE) ×4 IMPLANT
SYR 10ML LL (SYRINGE) IMPLANT
TOWEL OR NON WOVEN STRL DISP B (DISPOSABLE) ×2 IMPLANT
TRAP SPECIMEN MUCOUS 40CC (MISCELLANEOUS) IMPLANT
TRAY FOLEY MTR SLVR 16FR STAT (SET/KITS/TRAYS/PACK) ×2 IMPLANT
TROCAR XCEL NON-BLD 5MMX100MML (ENDOMECHANICALS) IMPLANT
UNDERPAD 30X36 HEAVY ABSORB (UNDERPADS AND DIAPERS) ×2 IMPLANT
WATER STERILE IRR 1000ML POUR (IV SOLUTION) ×2 IMPLANT
YANKAUER SUCT BULB TIP 10FT TU (MISCELLANEOUS) IMPLANT

## 2019-06-03 NOTE — Anesthesia Preprocedure Evaluation (Addendum)
Anesthesia Evaluation  Patient identified by MRN, date of birth, ID band Patient awake    Reviewed: Allergy & Precautions, NPO status , Patient's Chart, lab work & pertinent test results  Airway Mallampati: III  TM Distance: >3 FB Neck ROM: Full  Mouth opening: Limited Mouth Opening  Dental no notable dental hx. (+) Teeth Intact, Dental Advisory Given   Pulmonary neg pulmonary ROS,    Pulmonary exam normal breath sounds clear to auscultation       Cardiovascular negative cardio ROS Normal cardiovascular exam Rhythm:Regular Rate:Normal     Neuro/Psych  Headaches, negative psych ROS   GI/Hepatic negative GI ROS, Neg liver ROS,   Endo/Other  Morbid obesity  Renal/GU negative Renal ROS  negative genitourinary   Musculoskeletal negative musculoskeletal ROS (+)   Abdominal   Peds  Hematology  (+) Blood dyscrasia (Hgb 10.9), anemia ,   Anesthesia Other Findings   Reproductive/Obstetrics                            Anesthesia Physical Anesthesia Plan  ASA: III  Anesthesia Plan: General   Post-op Pain Management:    Induction: Intravenous  PONV Risk Score and Plan: 3 and Midazolam, Dexamethasone and Ondansetron  Airway Management Planned: Oral ETT  Additional Equipment:   Intra-op Plan:   Post-operative Plan: Extubation in OR  Informed Consent: I have reviewed the patients History and Physical, chart, labs and discussed the procedure including the risks, benefits and alternatives for the proposed anesthesia with the patient or authorized representative who has indicated his/her understanding and acceptance.     Dental advisory given  Plan Discussed with: CRNA  Anesthesia Plan Comments:         Anesthesia Quick Evaluation

## 2019-06-03 NOTE — H&P (Signed)
Consult Note: Gyn-Onc  Consult was requested by Dr. Royston Sinner for the evaluation of Braileigh TAWONNA KALP 38 y.o. female  CC:  Bilateral ovarian cysts  Assessment/Plan:  Ms. THEONE DIGIACOMO  is a 38 y.o.  year old abnormal uterine bleeding and bilateral ovarian cystic masses and uterine fibroids.  I discussed with Saraiyah that I am optimistic that she may not have malignancy given her young age, her normal tumor marker, and the mostly cystic appearance of her masses on ultrasound.  Because of this I think it is reasonable to consider fertility sparing options for her for surgery which she desires.  We will obtain a CT scan of the abdomen and pelvis to better characterize these masses given her obesity and the large size of the masses, and ultrasound and pelvic examination are limited ability to discriminate possible signs of malignancy.  We will follow-up the results of today's biopsy.  If it is benign in the endometrium then I do not have a good explanation for her bleeding other than the potential impact of submucosal fibroids.  I discussed with the patient that I do not perform uterine sparing resection of fibroids at hysteroscopic [facility] or laparoscopically.  I explained that the only treatment I offer for bleeding fibroids is hysterectomy.  Therefore I would not be able to accomplish a procedure for her fibroids and bleeding during the planned procedure.  She is understanding of this.  I am recommending bilateral ovarian cystectomy with frozen section.  If pathology reveals malignancy we would perform completion hysterectomy and BSO at the patient's request in addition to staging procedures.  If benign pathology is identified of surgery will be limited to the ovarian cystectomies.  I explained surgical risks of the procedure including  bleeding, infection, damage to internal organs (such as bladder,ureters, bowels), blood clot, reoperation and rehospitalization.  I explained anticipated recovery  and postoperative expectations.   HPI: Ms Sanora Balderston is a 38 year old P1 who is seen in consultation at the request of Dr Royston Sinner for evaluation of bilateral ovarian cysts and abnormal uterine bleeding.  The patient reported having abnormal menses with 3-week long periods for approximately 1 year.  She underwent an ultrasound scan to evaluate this on February 26, 2019 revealed a 5.6 x 6.2 x 6.7 cm uterus with multiple echogenic and shadowing masses.  These were within the myometrium consistent with fibroids.  The endometrium was 5.1 mm thick with no focal abnormality.  The right ovary was not discretely visualized and contain complex cystic mass in the right adnexa potentially containing some soft tissue component.  It measured approximately 8.2 x 7.5 x 6.4 cm.  The left ovary contained a complex cystic mass in the left adnexa measuring 7.8 x 6.1 x 5.9 cm with questionable continuity with the right adnexal abnormality.  Ca1 25 was drawn and was normal at 22.1 and CEA was normal at 1.1.  She was counseled and recommended to undergo surgical excision but felt best to be performed by GYN oncologist given the concern for potential occult malignancy  Patient otherwise reported history of 1 prior vaginal birth.  She is morbidly obese with a BMI of 40 kg/m.  She is not had prior abdominal surgeries.  The patient had a Pap test on April 09, 2019 that was normal and positive for HPV.  She had a history of a Gardasil vaccine.  Current Meds:  Outpatient Encounter Medications as of 05/02/2019  Medication Sig  . Ferrous Sulfate (SLOW FE PO) Take  by mouth.  Marland Kitchen ibuprofen (ADVIL) 800 MG tablet Take 1 tablet (800 mg total) by mouth every 8 (eight) hours as needed for moderate pain. For AFTER surgery only  . metroNIDAZOLE (METROGEL VAGINAL) 0.75 % vaginal gel Place 1 Applicatorful vaginally 2 (two) times daily. (Patient not taking: Reported on 02/13/2019)  . senna-docusate (SENOKOT-S) 8.6-50 MG tablet Take 2  tablets by mouth at bedtime. For AFTER surgery, do not take if having diarrhea  . tinidazole (TINDAMAX) 500 MG tablet Take 2 tablets (1,000 mg total) by mouth daily with breakfast. (Patient not taking: Reported on 02/13/2019)  . traMADol (ULTRAM) 50 MG tablet Take 1 tablet (50 mg total) by mouth every 6 (six) hours as needed for severe pain. For AFTER surgery only, do not take and drive   No facility-administered encounter medications on file as of 05/02/2019.     Allergy: No Known Allergies  Social Hx:   Social History   Socioeconomic History  . Marital status: Single    Spouse name: Not on file  . Number of children: 1  . Years of education: Not on file  . Highest education level: Not on file  Occupational History  . Occupation: Product manager: [employer]  Social Needs  . Financial resource strain: Not on file  . Food insecurity    Worry: Not on file    Inability: Not on file  . Transportation needs    Medical: Not on file    Non-medical: Not on file  Tobacco Use  . Smoking status: Never Smoker  . Smokeless tobacco: Never Used  Substance and Sexual Activity  . Alcohol use: Yes    Alcohol/week: 2.0 standard drinks    Types: 2 Glasses of wine per week    Comment: wine; SOCIALLY   . Drug use: No  . Sexual activity: Yes    Partners: Male    Birth control/protection: Condom, None  Lifestyle  . Physical activity    Days per week: Not on file    Minutes per session: Not on file  . Stress: Not on file  Relationships  . Social Herbalist on phone: Not on file    Gets together: Not on file    Attends religious service: Not on file    Active member of club or organization: Not on file    Attends meetings of clubs or organizations: Not on file    Relationship status: Not on file  . Intimate partner violence    Fear of current or ex partner: Not on file    Emotionally abused: Not on file    Physically abused: Not on file    Forced sexual  activity: Not on file  Other Topics Concern  . Not on file  Social History Narrative  . Not on file    Past Surgical Hx:  Past Surgical History:  Procedure Laterality Date  . WISDOM TOOTH EXTRACTION      Past Medical Hx:  Past Medical History:  Diagnosis Date  . Abnormal Papanicolaou smear of cervix   . Abnormal uterine bleeding 04/09/2019  . Anemia   . Fibroids   . Headache    OCC  . Leiomyoma of uterus 04/21/2019  . Mass of left ovary 04/09/2019  . Pap smear abnormality of cervix/human papillomavirus (HPV) positive   . Skin tags, multiple acquired   . Thrombocytosis [facility])     Past Gynecological History:  See HPI Patient's last menstrual period was 05/20/2019.  Family Hx:  Family History  Problem Relation Age of Onset  . Cancer Maternal Grandmother        lung    Review of Systems:  Constitutional  Feels well,    ENT Normal appearing ears and nares bilaterally Skin/Breast  No rash, sores, jaundice, itching, dryness Cardiovascular  No chest pain, shortness of breath, or edema  Pulmonary  No cough or wheeze.  Gastro Intestinal  No nausea, vomitting, or diarrhoea. No bright red blood per rectum, no abdominal pain, change in bowel movement, or constipation.  Genito Urinary  No frequency, urgency, dysuria, + abnormal uterine bleeding. Musculo Skeletal  No myalgia, arthralgia, joint swelling or pain  Neurologic  No weakness, numbness, change in gait,  Psychology  No depression, anxiety, insomnia.   Vitals:  Blood pressure (!) 154/109, pulse 87, temperature 99 F (37.2 C), temperature source Oral, resp. rate 16, height 5\' 6"  (1.676 m), weight 257 lb (116.6 kg), last menstrual period 05/20/2019, SpO2 100 %.  Physical Exam: WD in NAD Neck  Supple NROM, without any enlargements.  Lymph Node Survey No cervical supraclavicular or inguinal adenopathy Cardiovascular  Pulse normal rate, regularity and rhythm. S1 and S2 normal.  Lungs  Clear to auscultation  bilateraly, without wheezes/crackles/rhonchi. Good air movement.  Skin  No rash/lesions/breakdown  Psychiatry  Alert and oriented to person, place, and time  Abdomen  Normoactive bowel sounds, abdomen soft, non-tender and obese without evidence of hernia.  Back No CVA tenderness Genito Urinary  Vulva/vagina: Normal external female genitalia.   No lesions. No discharge or bleeding.  Bladder/urethra:  No lesions or masses, well supported bladder  Vagina: normal  Cervix: Normal appearing, no lesions.  Uterus: Slightly bulky, mobile, no parametrial involvement or nodularity.  Adnexa:fullness in the adnexa but no discrete masses. Rectal  deferred Extremities  No bilateral cyanosis, clubbing or edema.   Thereasa Solo, MD  06/03/2019, 1:49 PM

## 2019-06-03 NOTE — Transfer of Care (Signed)
Immediate Anesthesia Transfer of Care Note  Patient: Stacy [name]  Procedure(s) Performed: XI ROBOTIC ASSISTED LAPAROSCOPIC RIGHT OVARIAN CYSTECTOMY, LEFT SALPINGOOPHERECTOMY (Bilateral )  Patient Location: PACU  Anesthesia Type:General  Level of Consciousness: drowsy, patient cooperative and responds to stimulation  Airway & Oxygen Therapy: Patient Spontanous Breathing and Patient connected to face mask oxygen  Post-op Assessment: Report given to RN and Post -op Vital signs reviewed and stable  Post vital signs: Reviewed and stable  Last Vitals:  Vitals Value Taken Time  BP 159/104 06/03/19 1650  Temp    Pulse 96 06/03/19 1652  Resp 19 06/03/19 1652  SpO2 100 % 06/03/19 1652  Vitals shown include unvalidated device data.  Last Pain:  Vitals:   06/03/19 1152  TempSrc: Oral      Patients Stated Pain Goal: 4 ([id number])  Complications: No apparent anesthesia complications

## 2019-06-03 NOTE — Anesthesia Procedure Notes (Signed)
Procedure Name: Intubation Date/Time: 06/03/2019 2:11 PM Performed by: Mitzie Na, CRNA Pre-anesthesia Checklist: Patient identified, Emergency Drugs available, Suction available and Patient being monitored Patient Re-evaluated:Patient Re-evaluated prior to induction Oxygen Delivery Method: Circle system utilized Preoxygenation: Pre-oxygenation with 100% oxygen Induction Type: IV induction and Rapid sequence Laryngoscope Size: Mac and 4 Grade View: Grade I Tube type: Oral Tube size: 7.5 mm Number of attempts: 1 Airway Equipment and Method: Stylet and Oral airway Placement Confirmation: ETT inserted through vocal cords under direct vision,  positive ETCO2 and breath sounds checked- equal and bilateral Secured at: 23 cm Tube secured with: Tape Dental Injury: Teeth and Oropharynx as per pre-operative assessment

## 2019-06-03 NOTE — Anesthesia Postprocedure Evaluation (Signed)
Anesthesia Post Note  Patient: Stacy [name]  Procedure(s) Performed: XI ROBOTIC ASSISTED LAPAROSCOPIC RIGHT OVARIAN CYSTECTOMY, LEFT SALPINGOOPHERECTOMY (Bilateral )     Patient location during evaluation: PACU Anesthesia Type: General Level of consciousness: awake Pain management: pain level controlled Vital Signs Assessment: post-procedure vital signs reviewed and stable Respiratory status: spontaneous breathing Cardiovascular status: stable Postop Assessment: no apparent nausea or vomiting Anesthetic complications: no    Last Vitals:  Vitals:   06/03/19 1830 06/03/19 1902  BP: (!) 155/107 (!) 133/95  Pulse: 92 63  Resp: 16 17  Temp:  36.7 C  SpO2: 97% 99%    Last Pain:  Vitals:   06/03/19 1902  TempSrc:   PainSc: 3                  Shakeisha Horine

## 2019-06-03 NOTE — Discharge Instructions (Signed)
Return to work: 1 - 2 weeks.  Activity: 1. Be up and out of the bed during the day.  Take a nap if needed.  You may walk up steps but be careful and use the hand rail.  Stair climbing will tire you more than you think, you may need to stop part way and rest.   2. No lifting or straining for 4 weeks.  3. No driving for 1 weeks.  Do Not drive if you are taking narcotic pain medicine.  4. Shower daily.  Use soap and water on your incision and pat dry; don't rub.   5. No sexual activity and nothing in the vagina for 8 weeks.  Medications:  - Take ibuprofen and tylenol first line for pain control. Take these regularly (every 6 hours) to decrease the build up of pain.  - If necessary, for severe pain not relieved by ibuprofen, contact Dr Serita Grit office and you will be prescribed percocet.  - While taking percocet you should take sennakot every night to reduce the likelihood of constipation. If this causes diarrhea, stop its use.  Diet: 1. Low sodium Heart Healthy Diet is recommended.  2. It is safe to use a laxative if you have difficulty moving your bowels.   Wound Care: 1. Keep clean and dry.  Shower daily.  Reasons to call the Doctor:   Fever - Oral temperature greater than 100.4 degrees Fahrenheit  Foul-smelling vaginal discharge  Difficulty urinating  Nausea and vomiting  Increased pain at the site of the incision that is unrelieved with pain medicine.  Difficulty breathing with or without chest pain  New calf pain especially if only on one side  Sudden, continuing increased vaginal bleeding with or without clots.   Follow-up: 1. See Stacy [name] in 4 weeks.  Contacts: For questions or concerns you should contact:  Dr. Everitt [name] at [phone] After hours and on week-ends call [phone] and ask to speak to the physician on call for Gynecologic Oncology   Unilateral Salpingo-Oophorectomy, Care After This sheet gives you information about how to care for  yourself after your procedure. Your health care provider may also give you more specific instructions. If you have problems or questions, contact your health care provider. What can I expect after the procedure? After the procedure, it is common to have:  Abdominal pain.  Some occasional vaginal bleeding (spotting).  Tiredness. Follow these instructions at home: Incision care   Keep your incision area and your bandage (dressing) clean and dry.  Follow instructions from your health care provider about how to take care of your incision. Make sure you: ? Wash your hands with soap and water before you change your dressing. If soap and water are not available, use hand sanitizer. ? Change your dressing as told by your health care provider. ? Leave stitches (sutures), staples, skin glue, or adhesive strips in place. These skin closures may need to stay in place for 2 weeks or longer. If adhesive strip edges start to loosen and curl up, you may trim the loose edges. Do not remove adhesive strips completely unless your health care provider tells you to do that.  Check your incision area every day for signs of infection. Check for: ? Redness, swelling, or pain. ? Fluid or blood. ? Warmth. ? Pus or a bad smell. Activity  Do not drive or use heavy machinery while taking prescription pain medicine.  Do not drive for 24 hours if you received a medicine  to help you relax (sedative).  Take frequent, short walks throughout the day. Rest when you get tired. Ask your health care provider what activities are safe for you.  Avoid activities that require great effort. Also, avoid heavy lifting. Do not lift anything that is heavier than 5 lb (2.3 kg), or the limit that your health care provider tells you, until he or she says that it is safe to do so.  Do not douche, use tampons, or have sex until your health care provider approves. General instructions  To prevent or treat constipation while you are  taking prescription pain medicine, your health care provider may recommend that you: ? Drink enough fluid to keep your urine pale yellow. ? Take over-the-counter or prescription medicines. ? Eat foods that are high in fiber, such as fresh fruits and vegetables, whole grains, and beans. ? Limit foods that are high in fat and processed sugars, such as fried and sweet foods.  Take over-the-counter and prescription medicines only as told by your health care provider.  Do not take baths, swim, or use a hot tub until your health care provider approves. Ask your health care provider if you may take showers. You may only be allowed to take sponge baths.  Wear compression stockings as told by your health care provider. These stockings help to prevent blood clots and reduce swelling in your legs.  Keep all follow-up visits as told by your health care provider. This is important. Contact a health care provider if:  You have pain when you urinate.  You have pus or a bad smelling discharge coming from your vagina.  You have redness, swelling, or pain around your incision.  You have fluid or blood coming from your incision.  Your incision feels warm to the touch.  You have pus or a bad smell coming from your incision.  You have a fever.  Your incision starts to break open.  You have abdominal pain that gets worse or does not get better with medicine.  You develop a rash.  You develop nausea and vomiting.  You feel lightheaded. Get help right away if:  You develop pain in your chest or leg.  You develop shortness of breath.  You faint.  You have increased bleeding from your vagina. Summary  After the procedure, it is common to have pain, tiredness, and occasional bleeding from the vagina.  Follow instructions from your health care provider about how to take care of your incision.  Check your incision every day for signs of infection and report any symptoms to your health care  provider.  Follow instructions from your health care provider about activities and restrictions. This information is not intended to replace advice given to you by your health care provider. Make sure you discuss any questions you have with your health care provider. Document Released: 05/13/2009 Document Revised: 06/29/2017 Document Reviewed: 10/26/2016 Elsevier Patient Education  2020 [employer].

## 2019-06-03 NOTE — Op Note (Signed)
OPERATIVE NOTE 06/03/19  Surgeon: Donaciano Eva   Assistants: Dr Lahoma Crocker (an MD assistant was necessary for tissue manipulation, management of robotic instrumentation, retraction and positioning due to the complexity of the case and hospital policies).   Anesthesia: General endotracheal anesthesia  ASA Class: 3   Pre-operative Diagnosis: bilateral ovarian cysts  Post-operative Diagnosis: stage IV endometriosis  Operation: Robotic-assisted laparoscopic left salpingo-oophorectomy, right ovarian cystectomy and salpingectomy, adnexal adhesiolysis.  Surgeon: Donaciano Eva  Assistant Surgeon: Lahoma Crocker MD  Anesthesia: GET  Urine Output: 70cc  Operative Findings: 7cm left tubo-ovarian cystic complex. Replacement of left tube and ovary with endometrioma, no viable adnexal structures remaining. Obliterated posterior cul de sac. Sigmoid and rectum adherent to posterior lower uterine segment. Right tubal endometrioma. RIght ovarian endometrioma.     Estimated Blood Loss:  less than 50 mL      Total IV Fluids: 800 ml         Specimens: right ovarian cyst and tube, left tube and ovary. Washings.         Complications:  None; patient tolerated the procedure well.         Disposition: PACU - hemodynamically stable.  Procedure Details  The patient was seen in the Holding Room. The risks, benefits, complications, treatment options, and expected outcomes were discussed with the patient.  The patient concurred with the proposed plan, giving informed consent.  The site of surgery properly noted/marked. The patient was identified as Stacy [name] and the procedure verified as a robotic-assisted bilateral ovarian cystectomy. A Time Out was held and the above information confirmed.  After induction of anesthesia, the patient was draped and prepped in the usual sterile manner. Pt was placed in supine position after anesthesia and draped and prepped in the usual  sterile manner. The abdominal drape was placed after the CholoraPrep had been allowed to dry for 3 minutes.  Her arms were tucked to her side with all appropriate precautions.  The shoulders were stabilized with padded shoulder blocks applied to the acromium processes.  The patient was placed in the semi-lithotomy position in [place].  The perineum was prepped with Betadine. The patient was then prepped. Foley catheter was placed.  A sterile speculum was placed in the vagina.  The cervix was grasped with a single-tooth tenaculum and dilated with Kennon Rounds dilators.  The ZUMI uterine manipulator with a medium colpotomizer ring was placed without difficulty.  A pneum occluder balloon was placed over the manipulator.  OG tube placement was confirmed and to suction.   Next, a 5 mm skin incision was made 1 cm below the subcostal margin in the midclavicular line.  The 5 mm Optiview port and scope was used for direct entry.  Opening pressure was under 10 mm CO2.  The abdomen was insufflated and the findings were noted as above.   At this point and all points during the procedure, the patient's intra-abdominal pressure did not exceed 15 mmHg. Next, a 10 mm skin incision was made in the umbilicus and a right and left port was placed about 10 cm lateral to the robot port on the right and left side.  A fourth arm was placed in the left lower quadrant 2 cm above and superior and medial to the anterior superior iliac spine.  All ports were placed under direct visualization.  The patient was placed in steep Trendelenburg.  Bowel was folded away into the upper abdomen.  The robot was docked in the normal manner.  Photographs in Epic documented pathology.   For 90 minutes there was comprehensive adnexal adhesiolysis to normalize anatomy.   An attempt was made at left ovarian cystectomy.  However it became apparent that the left fallopian tube and left ovary were replaced with endometrioma.  It could not be safely  resected 25 normal adnexal structures.  Therefore decision was made to proceed with a left salpingo-oophorectomy.  The peritoneum was opened parallel to the ovarian vessels and the retroperitoneal space was entered.  The ureter was identified in the retroperitoneum and a window was generated ventral to the ureter to skeletonized the ovarian adherent to the pelvic brim.  : Sealed and transected.  The ureter was carefully dissected from its attachments and adhesions to the left ovarian tissue.  The utero-ovarian ligament was then bipolar fulgurated and transected.  The specimen was placed in Endo Catch bag and sent for frozen section pathology which revealed benign cystic and nodular and hemorrhagic structures.  On the right attempt was made to perform right ovarian cystectomy.  It was apparent that the cystic structure on the right had replaced the fallopian tube.  No viable fallopian tube remains in was not possible cholecystectomy without sacrificing the proximal tube.  There was extensive dissection of the right ovary in order to free cystic structures from the dense attachments to the ovarian stroma.  A moderate amount of ovarian stroma remained at the completion of the procedure.  In the process of resection of the tube and ovarian cyst, there is disruption of the right utero-ovarian ligament.  The right retroperitoneal space had been opened in a similar fashion to the left to identify the ureter and sure it was not adherent were involved with the dissection planes.  After the right ovarian cyst and fallopian tube were resected and placed in Endo Catch bag 0 Vicryl suture was used to close the peritoneum on the right and 2 reattached to the right ovary to the utero-ovarian ligament.  Photography was taken and captured in epic to document the status at the end of the procedure.  Sharp dissection was utilized to dissected the rectum from its attachment to the posterior lower uterine segment though there was  still some attachments on the left at the level of the cervix.  A piece of Surgicel was draped to cover the posterior surface of the uterine fundus and adnexa and any more tissue planes to reinforce hemostasis and decrease likelihood of bowel adhesions to the posterior uterus.  Irrigation was used and excellent hemostasis was achieved.  At this point in the procedure was completed.  Robotic instruments were removed under direct visulaization.  The robot was undocked. The 10 mm ports were closed with Vicryl on a UR-5 needle and the fascia was closed with 0 Vicryl on a UR-5 needle.  The skin was closed with 4-0 Vicryl in a subcuticular manner.  Dermabond was applied.  Sponge, lap and needle counts correct x 2.  The patient was taken to the recovery room in stable condition.  The vagina was swabbed with  minimal bleeding noted.   All instrument and needle counts were correct x  3.   The patient was transferred to the recovery room in a stable condition.  Donaciano Eva, MD

## 2019-06-04 ENCOUNTER — Telehealth: Payer: Self-pay

## 2019-06-04 ENCOUNTER — Encounter (HOSPITAL_COMMUNITY): Payer: Self-pay | Admitting: Gynecologic Oncology

## 2019-06-04 NOTE — Telephone Encounter (Signed)
Stacy [name] is doing well today.   She is eating, drinking, and urinating well. Using Ibuprofen for pain. Took one tramadol last night. Dressings are D&I currently. She has some slight bleeding from incisions during the night. Some blood spots noticed on her night clothes. She has not passed gas. She will begin the senokot-S now and then repeat this evening and use prn. She is aware of her post op appointment on 06-24-19.  She will call the office at [phone] if she has any questions or concerns.

## 2019-06-05 ENCOUNTER — Telehealth: Payer: Self-pay

## 2019-06-05 NOTE — Telephone Encounter (Signed)
Pt called stating the 1/2 of upper left incision has opened approximately 93mm. The is a small amount of blooding drainage on a bandage she applied to the area last pm.  I reassured the patient that there are layers of sutures underneath the skin.  I advised her to apply antibiotic ointment to the area and cover with a bandaid.  I told her to watch for s/s of infection ie yellow/green drainage, redness and/or heat at the site. She was advised to call with s/s of infection or any other concerns.  Ms Rozario verbalized understanding.

## 2019-06-06 LAB — CYTOLOGY - NON PAP

## 2019-06-06 LAB — SURGICAL PATHOLOGY

## 2019-06-10 ENCOUNTER — Telehealth: Payer: Self-pay

## 2019-06-10 NOTE — Telephone Encounter (Signed)
I spoke to Stacy [name] this am.  I let her know that her surgical pathology showed no cancer only endometriosis.  She verbalized understanding.

## 2019-06-23 ENCOUNTER — Other Ambulatory Visit: Payer: Self-pay

## 2019-06-24 ENCOUNTER — Encounter: Payer: Self-pay | Admitting: Gynecologic Oncology

## 2019-06-24 ENCOUNTER — Inpatient Hospital Stay: Payer: BC Managed Care – PPO | Attending: Gynecologic Oncology | Admitting: Gynecologic Oncology

## 2019-06-24 ENCOUNTER — Other Ambulatory Visit: Payer: Self-pay

## 2019-06-24 ENCOUNTER — Inpatient Hospital Stay: Payer: BC Managed Care – PPO

## 2019-06-24 VITALS — BP 144/91 | HR 99 | Temp 98.2°F | Resp 18 | Ht 66.0 in | Wt 259.9 lb

## 2019-06-24 DIAGNOSIS — N800 Endometriosis of uterus: Secondary | ICD-10-CM | POA: Diagnosis not present

## 2019-06-24 DIAGNOSIS — Z90721 Acquired absence of ovaries, unilateral: Secondary | ICD-10-CM | POA: Insufficient documentation

## 2019-06-24 DIAGNOSIS — N801 Endometriosis of ovary: Secondary | ICD-10-CM | POA: Insufficient documentation

## 2019-06-24 DIAGNOSIS — N809 Endometriosis, unspecified: Secondary | ICD-10-CM

## 2019-06-24 NOTE — Progress Notes (Signed)
Follow-up Note: Gyn-Onc  Consult was requested by Dr. Royston Sinner for the evaluation of Stacy [name] 38 y.o. female  CC:  Chief Complaint  Patient presents with  . fibroids and ovarian mass    Assessment/Plan:  Stacy [name]  is a 38 y.o.  year old with a history of abnormal uterine bleeding and bilateral ovarian cystic masses and uterine fibroids who is status post robotic assisted laparoscopic left salpingo-oophorectomy, right ovarian cystectomy and salpingectomy, adnexal adhesiolysis.  Final pathology revealed endometriosis involving the left tube and ovary, posterior uterine adhesion, and right fallopian tube.  She has residual right ovarian tissue remaining.  Therefore she is at risk for persistent endometriosis.  I am recommending consideration for ovarian suppression with continuous oral contraceptive pills or progestin releasing Mirena IUD.  She will return to Dr.Leger for ongoing medical management of her gynecologic conditions including medical management of endometriosis and consideration for hysteroscopic myomectomy should that be appropriate. We are checking [place] today to ensure that she has adequate residual hormonal function from the residual right ovary.   HPI: Ms Stacy [name] is a 38 year old P1 who is seen in consultation at the request of Dr Royston Sinner for evaluation of bilateral ovarian cysts and abnormal uterine bleeding.  The patient reported having abnormal menses with 3-week long periods for approximately 1 year.  She underwent an ultrasound scan to evaluate this on February 26, 2019 revealed a 5.6 x 6.2 x 6.7 cm uterus with multiple echogenic and shadowing masses.  These were within the myometrium consistent with fibroids.  The endometrium was 5.1 mm thick with no focal abnormality.  The right ovary was not discretely visualized and contain complex cystic mass in the right adnexa potentially containing some soft tissue component.  It measured approximately 8.2 x 7.5 x  6.4 cm.  The left ovary contained a complex cystic mass in the left adnexa measuring 7.8 x 6.1 x 5.9 cm with questionable continuity with the right adnexal abnormality.  Ca1 25 was drawn and was normal at 22.1 and CEA was normal at 1.1.  She was counseled and recommended to undergo surgical excision but felt best to be performed by GYN oncologist given the concern for potential occult malignancy  Patient otherwise reported history of 1 prior vaginal birth.  She is morbidly obese with a BMI of 40 kg/m.  She is not had prior abdominal surgeries.  The patient had a Pap test on April 09, 2019 that was normal and positive for HPV.  She had a history of a Gardasil vaccine.  Pre-operative endometrial biopsy was negative for cancer.  She had not yet completed childbearing and therefore I did not desire elective hysterectomy unless malignancy was identified intraoperatively.  I had explained to the patient preoperatively that I could not address her fibroids without hysterectomy, and she potentially would need a separate procedure with a gynecologist should she have issues with her fibroids.  Interval Hx: On 06/03/19 she underwent robotic assisted lysis of adhesions, left salpingo-oophorectomy, right ovarian cystectomy and salpingectomy. Surgery was technically challenging due to findings of stage IV endometriosis.  Intraoperative findings were consistent a 7 cm left tubo-ovarian cystic complex.  Replacement of the left tube and ovary with endometrioma, no viable adnexal structures remaining.  An obliterated posterior cul-de-sac.  Sigmoid and rectum were adherent to the posterior lower uterine segment.  A right tubal endometrioma was identified and a right ovarian endometrioma.  Frozen section was benign.  Final pathology revealed endometriosis of the  left tube and ovary, right fallopian tube and ovarian cyst, and posterior uterine adhesion.   Since surgery she has done well with no concerns. She has not  yet had a period.   Current Meds:  Outpatient Encounter Medications as of 06/24/2019  Medication Sig  . ibuprofen (ADVIL) 800 MG tablet Take 1 tablet (800 mg total) by mouth every 8 (eight) hours as needed for moderate pain. For AFTER surgery only  . senna-docusate (SENOKOT-S) 8.6-50 MG tablet Take 2 tablets by mouth at bedtime. For AFTER surgery, do not take if having diarrhea  . traMADol (ULTRAM) 50 MG tablet Take 1 tablet (50 mg total) by mouth every 6 (six) hours as needed for severe pain. For AFTER surgery only, do not take and drive   No facility-administered encounter medications on file as of 06/24/2019.     Allergy: No Known Allergies  Social Hx:   Social History   Socioeconomic History  . Marital status: Single    Spouse name: Not on file  . Number of children: 1  . Years of education: Not on file  . Highest education level: Not on file  Occupational History  . Occupation: Product manager: [employer]  Social Needs  . Financial resource strain: Not on file  . Food insecurity    Worry: Not on file    Inability: Not on file  . Transportation needs    Medical: Not on file    Non-medical: Not on file  Tobacco Use  . Smoking status: Never Smoker  . Smokeless tobacco: Never Used  Substance and Sexual Activity  . Alcohol use: Yes    Alcohol/week: 2.0 standard drinks    Types: 2 Glasses of wine per week    Comment: wine; SOCIALLY   . Drug use: No  . Sexual activity: Yes    Partners: Male    Birth control/protection: Condom, None  Lifestyle  . Physical activity    Days per week: Not on file    Minutes per session: Not on file  . Stress: Not on file  Relationships  . Social Herbalist on phone: Not on file    Gets together: Not on file    Attends religious service: Not on file    Active member of club or organization: Not on file    Attends meetings of clubs or organizations: Not on file    Relationship status: Not on file  .  Intimate partner violence    Fear of current or ex partner: Not on file    Emotionally abused: Not on file    Physically abused: Not on file    Forced sexual activity: Not on file  Other Topics Concern  . Not on file  Social History Narrative  . Not on file    Past Surgical Hx:  Past Surgical History:  Procedure Laterality Date  . ROBOTIC ASSISTED LAPAROSCOPIC OVARIAN CYSTECTOMY Bilateral 06/03/2019   Procedure: XI ROBOTIC ASSISTED LAPAROSCOPIC RIGHT OVARIAN CYSTECTOMY, LEFT SALPINGOOPHERECTOMY;  Surgeon: Everitt Amber, MD;  Location: [facility];  Service: Gynecology;  Laterality: Bilateral;  . WISDOM TOOTH EXTRACTION      Past Medical Hx:  Past Medical History:  Diagnosis Date  . Abnormal Papanicolaou smear of cervix   . Abnormal uterine bleeding 04/09/2019  . Anemia   . Fibroids   . Headache    OCC  . Leiomyoma of uterus 04/21/2019  . Mass of left ovary 04/09/2019  . Pap smear abnormality  of cervix/human papillomavirus (HPV) positive   . Skin tags, multiple acquired   . Thrombocytosis ([place])     Past Gynecological History:  See HPI No LMP recorded.  Family Hx:  Family History  Problem Relation Age of Onset  . Cancer Maternal Grandmother        lung    Review of Systems:  Constitutional  Feels well,    ENT Normal appearing ears and nares bilaterally Skin/Breast  No rash, sores, jaundice, itching, dryness Cardiovascular  No chest pain, shortness of breath, or edema  Pulmonary  No cough or wheeze.  Gastro Intestinal  No nausea, vomitting, or diarrhoea. No bright red blood per rectum, no abdominal pain, change in bowel movement, or constipation.  Genito Urinary  No frequency, urgency, dysuria, + abnormal uterine bleeding. Musculo Skeletal  No myalgia, arthralgia, joint swelling or pain  Neurologic  No weakness, numbness, change in gait,  Psychology  No depression, anxiety, insomnia.   Vitals:  Blood pressure (!) 144/91, pulse 99, temperature 98.2 F (36.8 C),  temperature source Temporal, resp. rate 18, height 5\' 6"  (1.676 m), weight 259 lb 14.4 oz (117.9 kg), SpO2 100 %.  Physical Exam: WD in NAD Neck  Supple NROM, without any enlargements.  Lymph Node Survey No cervical supraclavicular or inguinal adenopathy Cardiovascular  Pulse normal rate, regularity and rhythm. S1 and S2 normal.  Lungs  Clear to auscultation bilateraly, without wheezes/crackles/rhonchi. Good air movement.  Skin  No rash/lesions/breakdown  Psychiatry  Alert and oriented to person, place, and time  Abdomen  Normoactive bowel sounds, abdomen soft, non-tender and obese without evidence of hernia. Well healed incisions.  Back No CVA tenderness Genito Urinary  deferred Rectal  deferred Extremities  No bilateral cyanosis, clubbing or edema.   Thereasa Solo, MD  06/24/2019, 1:34 PM

## 2019-06-24 NOTE — Patient Instructions (Signed)
Dr Denman George is recommending consideration of taking continuous birth control pills for your endometriosis. Alternatively, an IUD that releases progesterone would help.  Dr Denman George removed both fallopian tubes, and therefore you will require assisted reproductive technology (such as IVF) to get pregnant on your own. You stilll have a right ovary, however, she will check your hormonal levels today to see how much hormonal activity is left on that side.  Please follow-up with Dr Royston Sinner in the new year to discuss birth control pills and consideration for fibroid surgery if you continue to have heavy periods.

## 2019-06-25 ENCOUNTER — Other Ambulatory Visit: Payer: Self-pay

## 2019-06-25 ENCOUNTER — Encounter: Payer: Self-pay | Admitting: Family Medicine

## 2019-06-25 ENCOUNTER — Inpatient Hospital Stay: Payer: BC Managed Care – PPO

## 2019-06-25 ENCOUNTER — Other Ambulatory Visit: Payer: Self-pay | Admitting: Family Medicine

## 2019-06-25 ENCOUNTER — Ambulatory Visit (INDEPENDENT_AMBULATORY_CARE_PROVIDER_SITE_OTHER): Payer: BC Managed Care – PPO | Admitting: Family Medicine

## 2019-06-25 VITALS — BP 128/80 | HR 82 | Ht 66.0 in | Wt 261.0 lb

## 2019-06-25 DIAGNOSIS — Z0000 Encounter for general adult medical examination without abnormal findings: Secondary | ICD-10-CM

## 2019-06-25 DIAGNOSIS — N809 Endometriosis, unspecified: Secondary | ICD-10-CM

## 2019-06-25 DIAGNOSIS — D500 Iron deficiency anemia secondary to blood loss (chronic): Secondary | ICD-10-CM

## 2019-06-25 DIAGNOSIS — Z7689 Persons encountering health services in other specified circumstances: Secondary | ICD-10-CM

## 2019-06-25 DIAGNOSIS — N801 Endometriosis of ovary: Secondary | ICD-10-CM | POA: Diagnosis not present

## 2019-06-25 NOTE — Progress Notes (Addendum)
Stacy [name] is a 38 y.o. female  Chief Complaint  Patient presents with  . Annual Exam    HPI: Stacy [name] is a 38 y.o. female here to establish care with our office and for annual CPE, labs. Pt has a 6yo son (1st grade) Pt follows with Dr. Lucillie Garfinkel (OB-Gyn) and Dr. Everitt Amber (Gyn-Onc). No issues or concerns today.   Last PAP: 06/2019  Diet/Exercise: diet needs to be improved, needs more exercise   Med refills needed: none  Past Medical History:  Diagnosis Date  . Abnormal Papanicolaou smear of cervix   . Abnormal uterine bleeding 04/09/2019  . Anemia   . Fibroids   . Headache    OCC  . Leiomyoma of uterus 04/21/2019  . Mass of left ovary 04/09/2019  . Pap smear abnormality of cervix/human papillomavirus (HPV) positive   . Skin tags, multiple acquired   . Thrombocytosis ([place])     Past Surgical History:  Procedure Laterality Date  . ROBOTIC ASSISTED LAPAROSCOPIC OVARIAN CYSTECTOMY Bilateral 06/03/2019   Procedure: XI ROBOTIC ASSISTED LAPAROSCOPIC RIGHT OVARIAN CYSTECTOMY, LEFT SALPINGOOPHERECTOMY;  Surgeon: Everitt Amber, MD;  Location: [facility];  Service: Gynecology;  Laterality: Bilateral;  . WISDOM TOOTH EXTRACTION      Social History   Socioeconomic History  . Marital status: Single    Spouse name: Not on file  . Number of children: 1  . Years of education: Not on file  . Highest education level: Not on file  Occupational History  . Occupation: Product manager: [employer]  Social Needs  . Financial resource strain: Not on file  . Food insecurity    Worry: Not on file    Inability: Not on file  . Transportation needs    Medical: Not on file    Non-medical: Not on file  Tobacco Use  . Smoking status: Never Smoker  . Smokeless tobacco: Never Used  Substance and Sexual Activity  . Alcohol use: Yes    Alcohol/week: 2.0 standard drinks    Types: 2 Glasses of wine per week    Comment: wine; SOCIALLY   . Drug use: No  .  Sexual activity: Yes    Partners: Male    Birth control/protection: Condom, None  Lifestyle  . Physical activity    Days per week: Not on file    Minutes per session: Not on file  . Stress: Not on file  Relationships  . Social Herbalist on phone: Not on file    Gets together: Not on file    Attends religious service: Not on file    Active member of club or organization: Not on file    Attends meetings of clubs or organizations: Not on file    Relationship status: Not on file  . Intimate partner violence    Fear of current or ex partner: Not on file    Emotionally abused: Not on file    Physically abused: Not on file    Forced sexual activity: Not on file  Other Topics Concern  . Not on file  Social History Narrative  . Not on file    Family History  Problem Relation Age of Onset  . Cancer Maternal Grandmother        lung     Immunization History  Administered Date(s) Administered  . HPV 9-valent 01/08/2018, 03/18/2018, 09/19/2018    Outpatient Encounter Medications as of 06/25/2019  Medication Sig  . [  DISCONTINUED] ibuprofen (ADVIL) 800 MG tablet Take 1 tablet (800 mg total) by mouth every 8 (eight) hours as needed for moderate pain. For AFTER surgery only  . [DISCONTINUED] senna-docusate (SENOKOT-S) 8.6-50 MG tablet Take 2 tablets by mouth at bedtime. For AFTER surgery, do not take if having diarrhea  . [DISCONTINUED] traMADol (ULTRAM) 50 MG tablet Take 1 tablet (50 mg total) by mouth every 6 (six) hours as needed for severe pain. For AFTER surgery only, do not take and drive   No facility-administered encounter medications on file as of 06/25/2019.      ROS: Pertinent positives and negatives noted in HPI. Remainder of ROS non-contributory  No Known Allergies  BP 128/80   Pulse 82   Ht 5\' 6"  (1.676 m)   Wt 261 lb (118.4 kg)   SpO2 94%   BMI 42.13 kg/m   Physical Exam  Constitutional: She is oriented to person, place, and time. She appears  well-developed and well-nourished. No distress.  HENT:  Head: Normocephalic and atraumatic.  Right Ear: Tympanic membrane and ear canal normal.  Left Ear: Tympanic membrane and ear canal normal.  Nose: Nose normal.  Mouth/Throat: Oropharynx is clear and moist and mucous membranes are normal.  Eyes: Pupils are equal, round, and reactive to light. Conjunctivae are normal.  Neck: Neck supple. No thyromegaly present.  Cardiovascular: Normal rate, regular rhythm, normal heart sounds and intact distal pulses.  No murmur heard. Pulmonary/Chest: Effort normal and breath sounds normal. No respiratory distress. She has no wheezes. She has no rhonchi.  Abdominal: Soft. Bowel sounds are normal. She exhibits no distension and no mass. There is no abdominal tenderness.  Musculoskeletal: Normal range of motion.        General: No edema.  Lymphadenopathy:    She has no cervical adenopathy.  Neurological: She is alert and oriented to person, place, and time. She exhibits normal muscle tone. Coordination normal.  Skin: Skin is warm and dry.  2 large skin tags - Rt axilla, Rt breast - not inflamed or irritated  Psychiatric: She has a normal mood and affect. Her behavior is normal.     A/P:  1. Annual physical exam - discussed importance of regular CV exercise, healthy diet, adequate sleep - had recent labs in 05/2019 - Lipid panel; Future - VITAMIN D 25 Hydroxy (Vit-D Deficiency, Fractures); Future - UTD on vision exam, dental exam - PAP UTD - next CPE in 1 year  2. Encounter to establish care with new doctor  3. Iron deficiency anemia due to chronic blood loss - follows with GYN - recent surgery in 04/2019  This visit occurred during the SARS-CoV-2 public health emergency.  Safety protocols were in place, including screening questions prior to the visit, additional usage of staff PPE, and extensive cleaning of exam room while observing appropriate contact time as indicated for disinfecting  solutions.

## 2019-06-25 NOTE — Patient Instructions (Signed)
Health Maintenance, Female Adopting a healthy lifestyle and getting preventive care are important in promoting health and wellness. Ask your health care provider about:  The right schedule for you to have regular tests and exams.  Things you can do on your own to prevent diseases and keep yourself healthy. What should I know about diet, weight, and exercise? Eat a healthy diet   Eat a diet that includes plenty of vegetables, fruits, low-fat dairy products, and lean protein.  Do not eat a lot of foods that are high in solid fats, added sugars, or sodium. Maintain a healthy weight Body mass index (BMI) is used to identify weight problems. It estimates body fat based on height and weight. Your health care provider can help determine your BMI and help you achieve or maintain a healthy weight. Get regular exercise Get regular exercise. This is one of the most important things you can do for your health. Most adults should:  Exercise for at least 150 minutes each week. The exercise should increase your heart rate and make you sweat (moderate-intensity exercise).  Do strengthening exercises at least twice a week. This is in addition to the moderate-intensity exercise.  Spend less time sitting. Even light physical activity can be beneficial. Watch cholesterol and blood lipids Have your blood tested for lipids and cholesterol at 37 years of age, then have this test every 5 years. Have your cholesterol levels checked more often if:  Your lipid or cholesterol levels are high.  You are older than 37 years of age.  You are at high risk for heart disease. What should I know about cancer screening? Depending on your health history and family history, you may need to have cancer screening at various ages. This may include screening for:  Breast cancer.  Cervical cancer.  Colorectal cancer.  Skin cancer.  Lung cancer. What should I know about heart disease, diabetes, and high blood  pressure? Blood pressure and heart disease  High blood pressure causes heart disease and increases the risk of stroke. This is more likely to develop in people who have high blood pressure readings, are of African descent, or are overweight.  Have your blood pressure checked: ? Every 3-5 years if you are 18-39 years of age. ? Every year if you are 40 years old or older. Diabetes Have regular diabetes screenings. This checks your fasting blood sugar level. Have the screening done:  Once every three years after age 40 if you are at a normal weight and have a low risk for diabetes.  More often and at a younger age if you are overweight or have a high risk for diabetes. What should I know about preventing infection? Hepatitis B If you have a higher risk for hepatitis B, you should be screened for this virus. Talk with your health care provider to find out if you are at risk for hepatitis B infection. Hepatitis C Testing is recommended for:  Everyone born from 1945 through 1965.  Anyone with known risk factors for hepatitis C. Sexually transmitted infections (STIs)  Get screened for STIs, including gonorrhea and chlamydia, if: ? You are sexually active and are younger than 37 years of age. ? You are older than 37 years of age and your health care provider tells you that you are at risk for this type of infection. ? Your sexual activity has changed since you were last screened, and you are at increased risk for chlamydia or gonorrhea. Ask your health care provider if   you are at risk.  Ask your health care provider about whether you are at high risk for HIV. Your health care provider may recommend a prescription medicine to help prevent HIV infection. If you choose to take medicine to prevent HIV, you should first get tested for HIV. You should then be tested every 3 months for as long as you are taking the medicine. Pregnancy  If you are about to stop having your period (premenopausal) and  you may become pregnant, seek counseling before you get pregnant.  Take 400 to 800 micrograms (mcg) of folic acid every day if you become pregnant.  Ask for birth control (contraception) if you want to prevent pregnancy. Osteoporosis and menopause Osteoporosis is a disease in which the bones lose minerals and strength with aging. This can result in bone fractures. If you are 65 years old or older, or if you are at risk for osteoporosis and fractures, ask your health care provider if you should:  Be screened for bone loss.  Take a calcium or vitamin D supplement to lower your risk of fractures.  Be given hormone replacement therapy (HRT) to treat symptoms of menopause. Follow these instructions at home: Lifestyle  Do not use any products that contain nicotine or tobacco, such as cigarettes, e-cigarettes, and chewing tobacco. If you need help quitting, ask your health care provider.  Do not use street drugs.  Do not share needles.  Ask your health care provider for help if you need support or information about quitting drugs. Alcohol use  Do not drink alcohol if: ? Your health care provider tells you not to drink. ? You are pregnant, may be pregnant, or are planning to become pregnant.  If you drink alcohol: ? Limit how much you use to 0-1 drink a day. ? Limit intake if you are breastfeeding.  Be aware of how much alcohol is in your drink. In the [place] one drink equals one 12 oz bottle of beer (355 mL), one 5 oz glass of wine (148 mL), or one 1 oz glass of hard liquor (44 mL). General instructions  Schedule regular health, dental, and eye exams.  Stay current with your vaccines.  Tell your health care provider if: ? You often feel depressed. ? You have ever been abused or do not feel safe at home. Summary  Adopting a healthy lifestyle and getting preventive care are important in promoting health and wellness.  Follow your health care provider's instructions about healthy  diet, exercising, and getting tested or screened for diseases.  Follow your health care provider's instructions on monitoring your cholesterol and blood pressure. This information is not intended to replace advice given to you by your health care provider. Make sure you discuss any questions you have with your health care provider. Document Released: 01/30/2011 Document Revised: 07/10/2018 Document Reviewed: 07/10/2018 Elsevier Patient Education  2020 [employer]  

## 2019-06-26 LAB — FOLLICLE STIMULATING HORMONE: FSH: 5.7 m[IU]/mL

## 2019-06-30 ENCOUNTER — Telehealth: Payer: Self-pay

## 2019-06-30 NOTE — Telephone Encounter (Signed)
Told Stacy [name] that the [facility] level was 5.7 which indicates that the right ovary should still be functioning and ovulating per Stacy John, NP.

## 2019-07-03 LAB — VITAMIN D 25 HYDROXY (VIT D DEFICIENCY, FRACTURES): Vit D, 25-Hydroxy: 4.5 ng/mL — ABNORMAL LOW (ref 30.0–100.0)

## 2019-07-03 LAB — LIPID PANEL
Chol/HDL Ratio: 3.5 ratio (ref 0.0–4.4)
Cholesterol, Total: 158 mg/dL (ref 100–199)
HDL: 45 mg/dL (ref >39)
LDL Chol Calc (NIH): 95 mg/dL (ref 0–99)
Triglycerides: 97 mg/dL (ref 0–149)
VLDL Cholesterol Cal: 18 mg/dL (ref 5–40)

## 2019-07-09 ENCOUNTER — Other Ambulatory Visit: Payer: Self-pay | Admitting: Family Medicine

## 2019-07-09 ENCOUNTER — Encounter: Payer: Self-pay | Admitting: Family Medicine

## 2019-07-09 DIAGNOSIS — E559 Vitamin D deficiency, unspecified: Secondary | ICD-10-CM | POA: Insufficient documentation

## 2019-07-09 MED ORDER — VITAMIN D (ERGOCALCIFEROL) 1.25 MG (50000 UNIT) PO CAPS: 50000.0000 [IU] | ORAL_CAPSULE | ORAL | 3 refills | Status: DC

## 2019-10-02 ENCOUNTER — Other Ambulatory Visit: Payer: Self-pay | Admitting: Family Medicine

## 2019-10-02 DIAGNOSIS — E559 Vitamin D deficiency, unspecified: Secondary | ICD-10-CM

## 2019-12-24 ENCOUNTER — Other Ambulatory Visit: Payer: Self-pay | Admitting: Family Medicine

## 2019-12-24 DIAGNOSIS — E559 Vitamin D deficiency, unspecified: Secondary | ICD-10-CM

## 2021-06-29 IMAGING — US US PELVIS COMPLETE WITH TRANSVAGINAL
1 series · 14 of 25 positions shown · non-contrast
Comparison: None

CLINICAL DATA: Dysfunctional uterine bleeding, history of fibroids

EXAM:
TRANSABDOMINAL AND TRANSVAGINAL ULTRASOUND OF PELVIS
TECHNIQUE: Both transabdominal and transvaginal ultrasound examinations of the
pelvis were performed. Transabdominal technique was performed for
global imaging of the pelvis including uterus, ovaries, adnexal
regions, and pelvic cul-de-sac. It was necessary to proceed with
endovaginal exam following the transabdominal exam to visualize the
uterus endometrium adnexa.

[Series 1: us pelvis complete with transvaginal · 74 acquisitions, 14 frames shown]
[im 1/74]
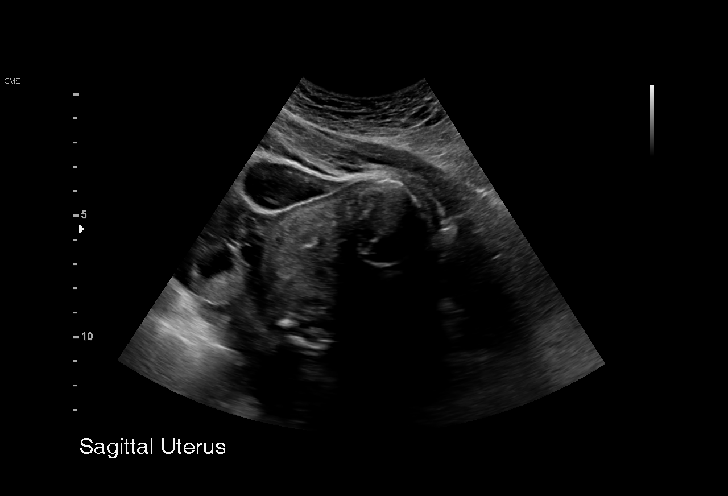
[im 7/74]
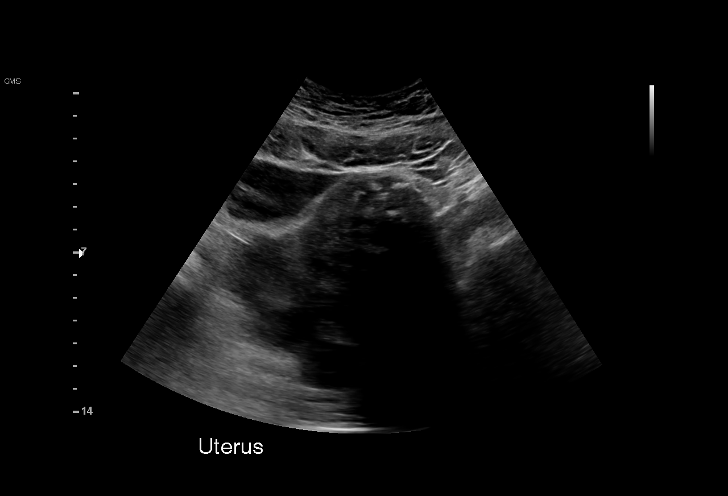
[im 13/74]
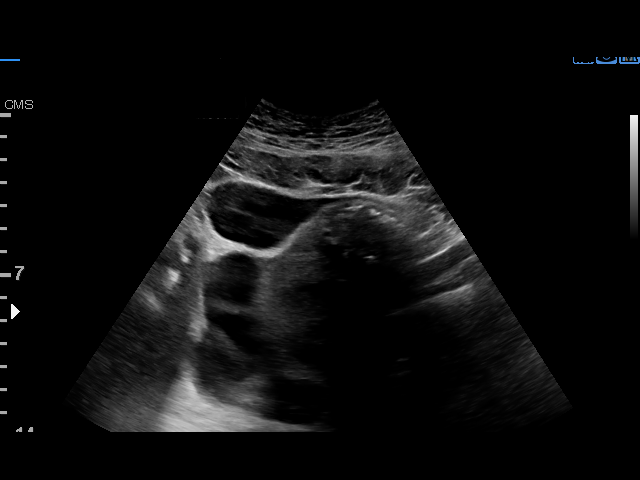
[im 19/74]
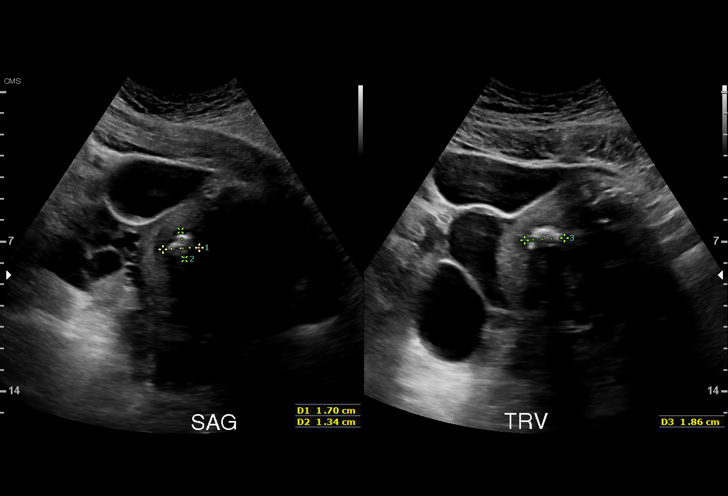
[im 25/74]
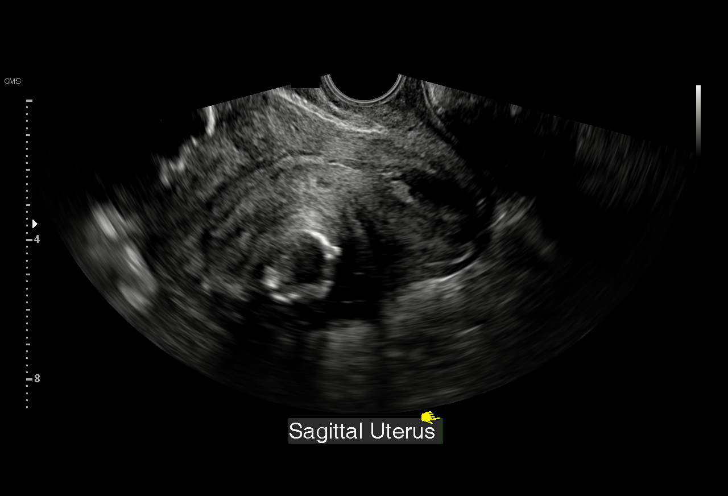
[im 28/74]
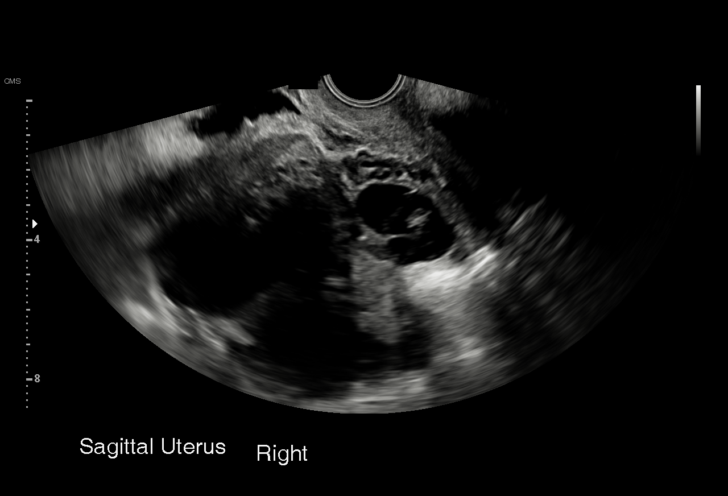
[im 34/74]
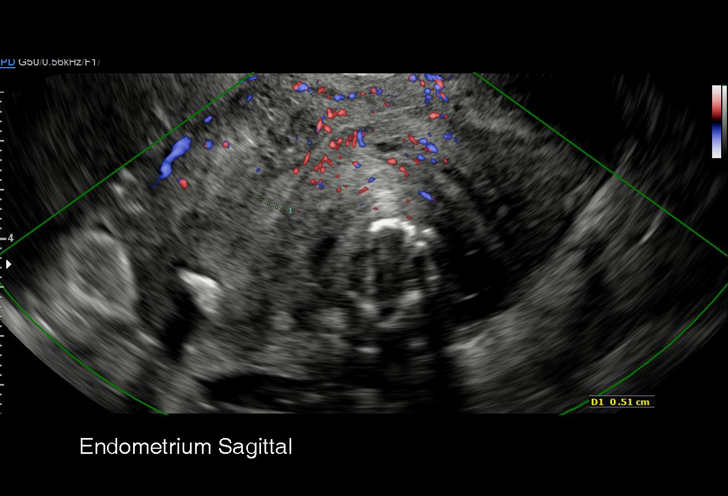
[im 40/74]
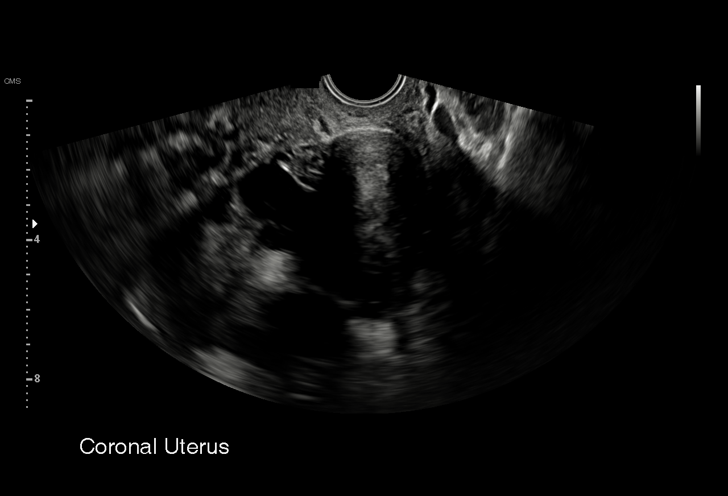
[im 46/74]
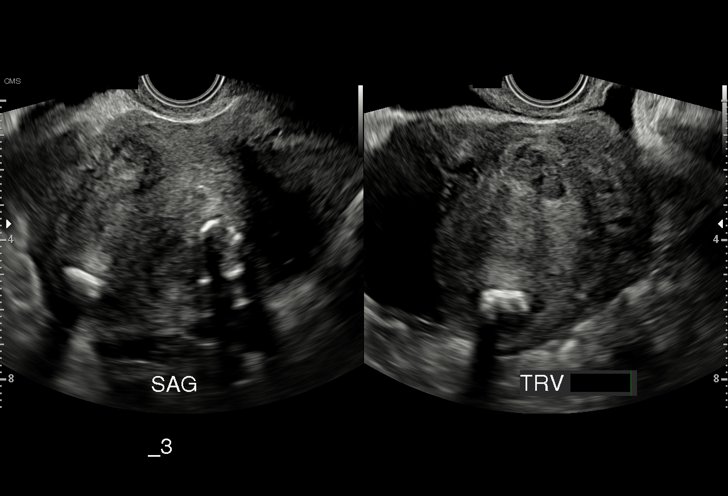
[im 49/74]
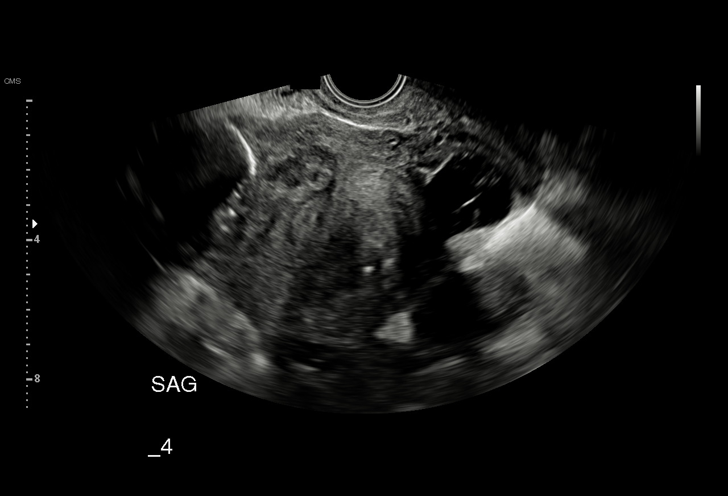
[im 55/74]
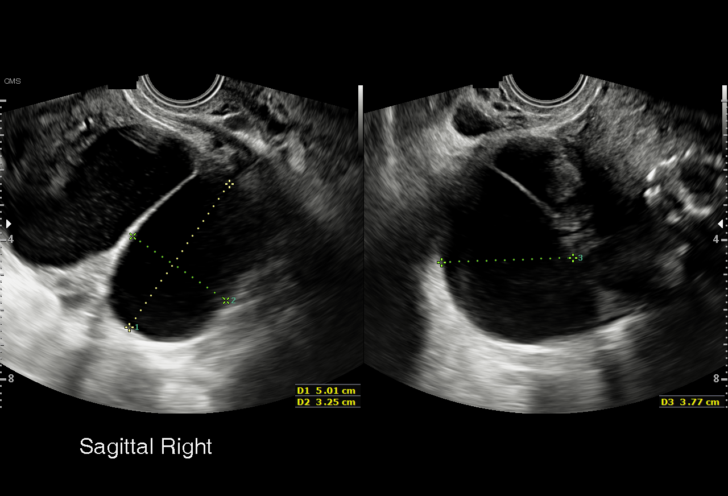
[im 61/74]
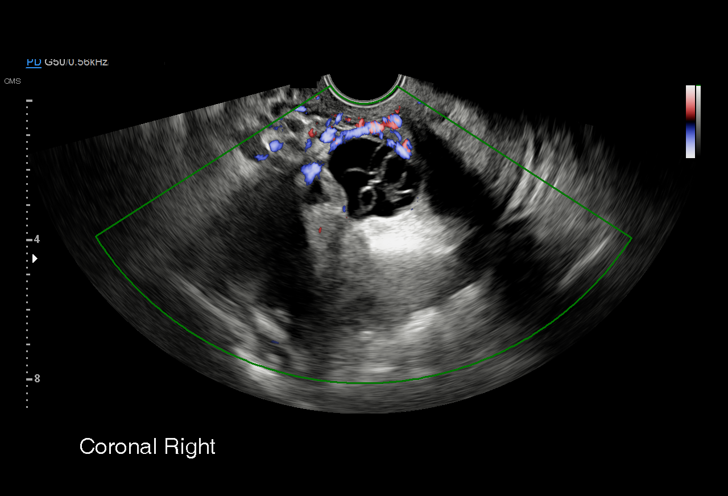
[im 67/74]
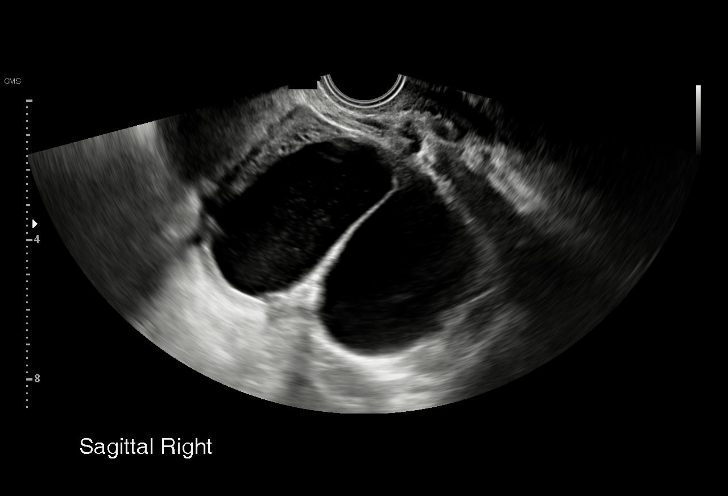
[im 74/74]
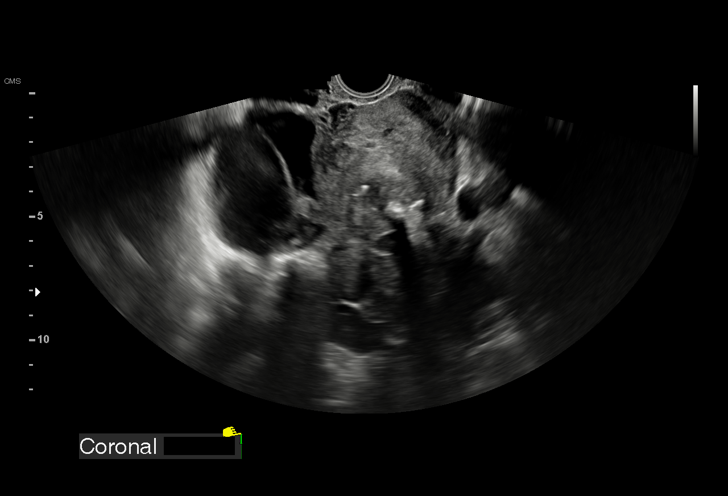

[14 of 25 positions shown; findings below may reference images not displayed]

FINDINGS: Uterus

Measurements: 5.6 x 6.2 x 6.2 cm = volume: 112.6 mL. Multiple
echogenic and shadowing masses within the myometrium, consistent
with partially calcified fibroids. Posterior mid uterine fibroid
measures 2.9 x 2.2 x 2.2 cm. Partially exophytic fibroid with arc
calcification and shadowing off the left anterior uterus measuring
4.3 x 3.9 x 3.7 cm. Small shadowing echogenic mass right posterior
uterine wall measuring 2.2 x 1.6 x 1 cm.

Endometrium

Thickness: 5.1 mm.  No focal abnormality.

Right ovary

Not discretely visualized. Complex cystic mass in the right adnexa,
potentially containing some soft tissue component. This measures
approximately 8.2 x 7.5 x 6.4 cm.

Left ovary

Not discretely visualized. Complex cystic mass in the left adnexal
region with potential soft tissue component. This measures 7.8 x
x 5.9 cm. Questionable continuity with the right adnexal
abnormality.

Other findings

No abnormal free fluid.
IMPRESSION: 1. Multiple calcified uterine fibroids.
2. Endometrial thickness of 5.1 mm. If bleeding remains unresponsive
to hormonal or medical therapy, sonohysterogram should be considered
for focal lesion work-up. (Ref: Radiological Reasoning: Algorithmic
Workup of Abnormal Vaginal Bleeding with Endovaginal Sonography and
Sonohysterography. AJR [id number]; 191:[id number])
3. Ovaries could not be discretely visualized. Large complex cystic
masses within both adnexa, potentially containing soft tissue
component, either representing complex bilateral adnexal masses or
single large adnexal mass with bilateral extension. Surgical
consultation is recommended.

## 2021-09-10 IMAGING — CT CT ABD-PELV W/ CM
2 of 5 series · 15 of 46 positions shown, 17 images · IV contrast (OMNIPAQUE)
Comparison: Pelvic ultrasound 02/25/2019

CLINICAL DATA: Palpable pelvic mass,. Abnormal sonogram on
02/25/2019

EXAM:
CT ABDOMEN AND PELVIS WITH CONTRAST
TECHNIQUE: Multidetector CT imaging of the abdomen and pelvis was performed
using the standard protocol following bolus administration of
intravenous contrast.
CONTRAST:  100mL OMNIPAQUE IOHEXOL 300 MG/ML  SOLN

[Series 2: axial st · axial · 0.65mm/px · z∈[+1203,+1593]mm · 12 of 94 slices shown, 14 images]
[im 8/94  soft-tissue]
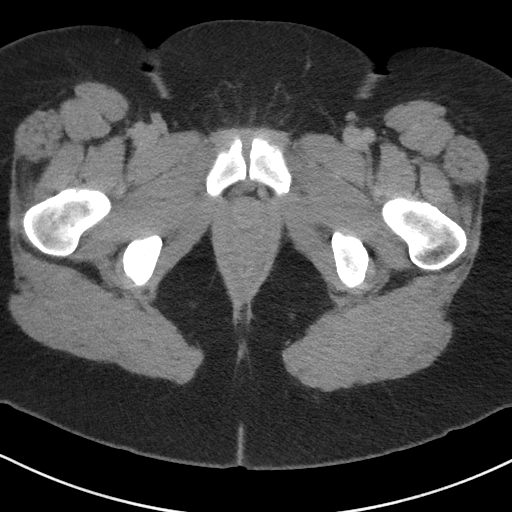
[im 8/94  bone]
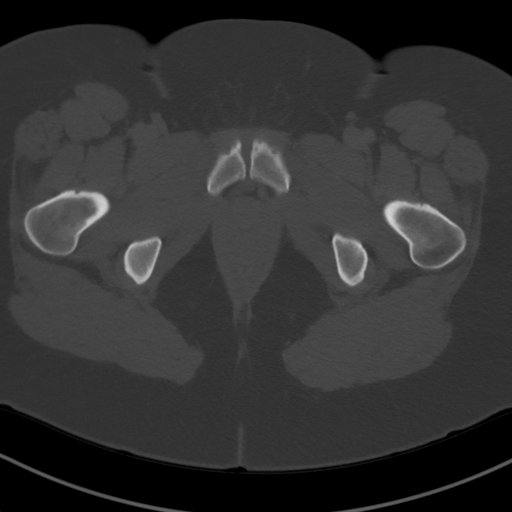
[im 15/94  soft-tissue]
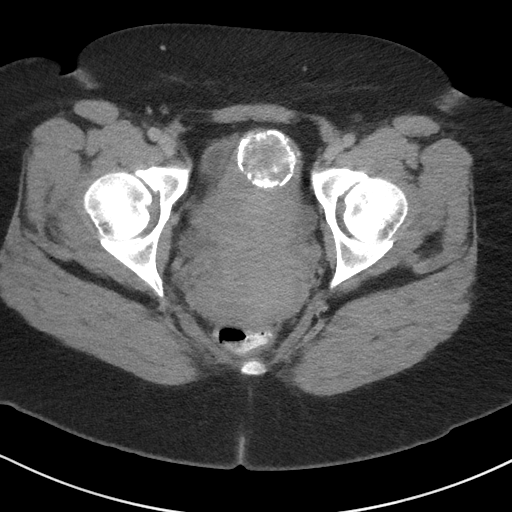
[im 22/94  soft-tissue]
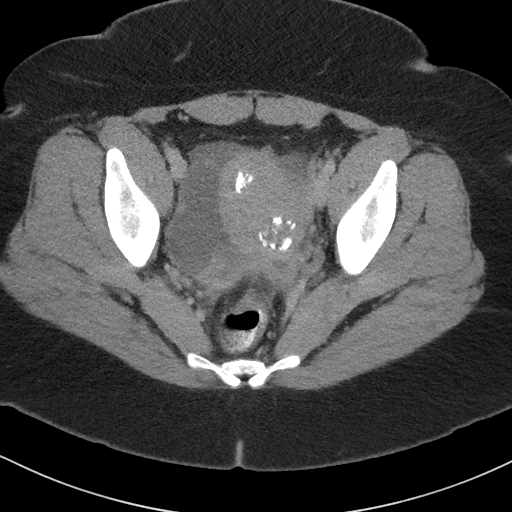
[im 29/94  soft-tissue]
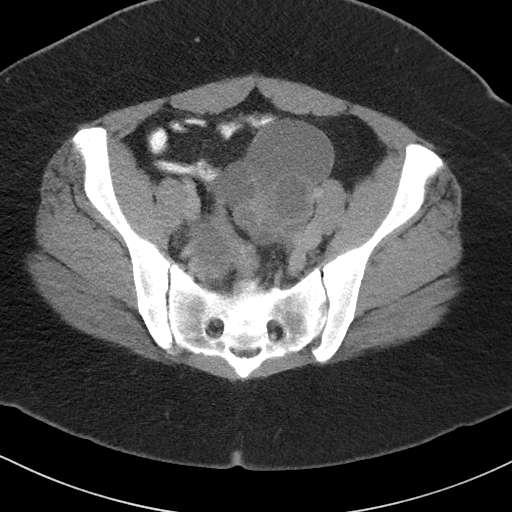
[im 36/94  soft-tissue]
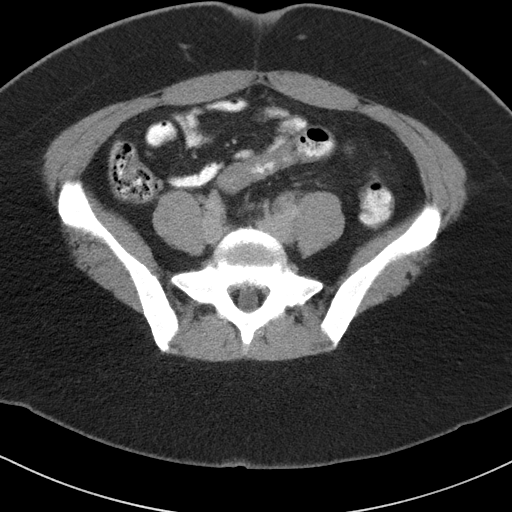
[im 43/94  soft-tissue]
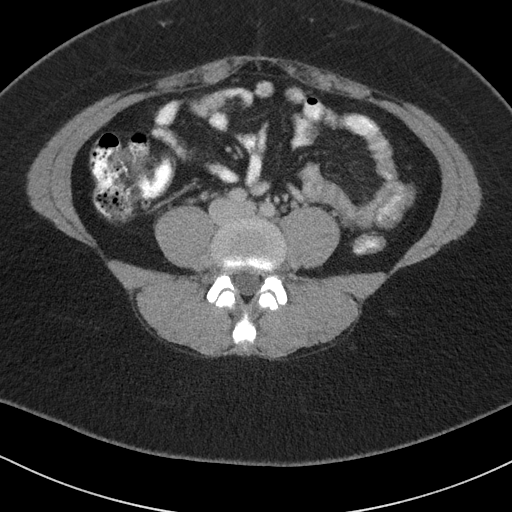
[im 51/94  soft-tissue]
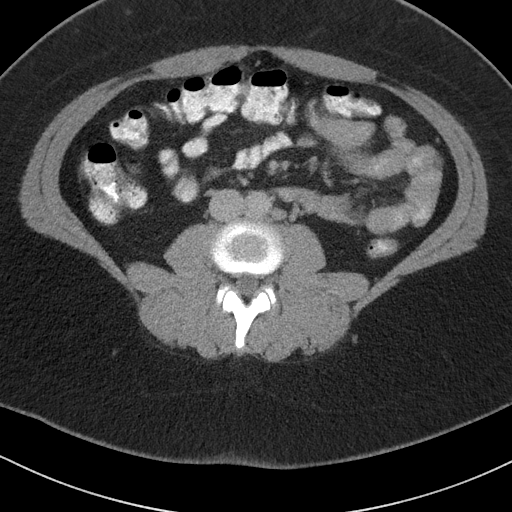
[im 58/94  soft-tissue]
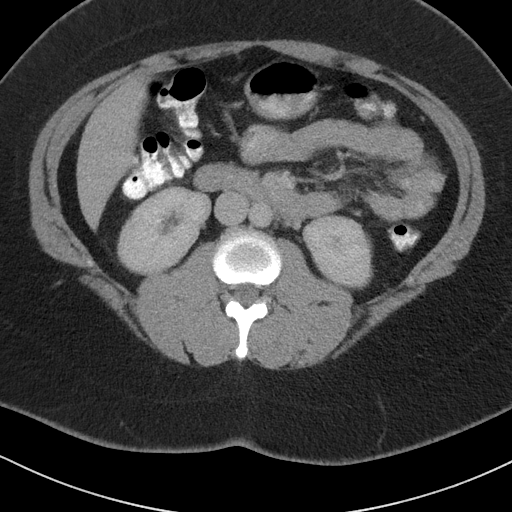
[im 65/94  soft-tissue]
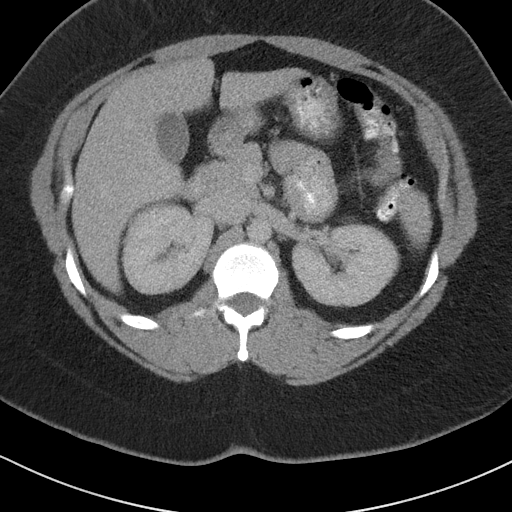
[im 65/94  bone]
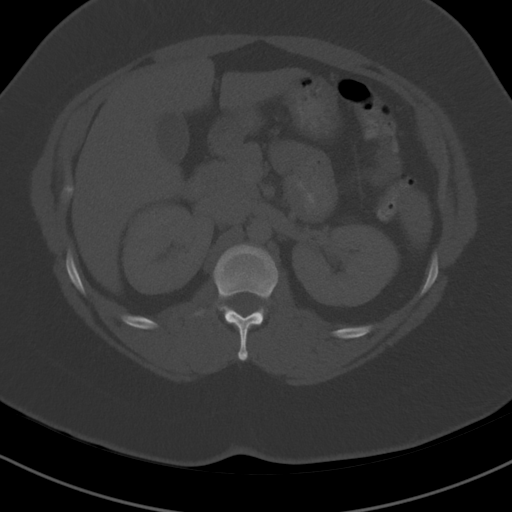
[im 72/94  soft-tissue]
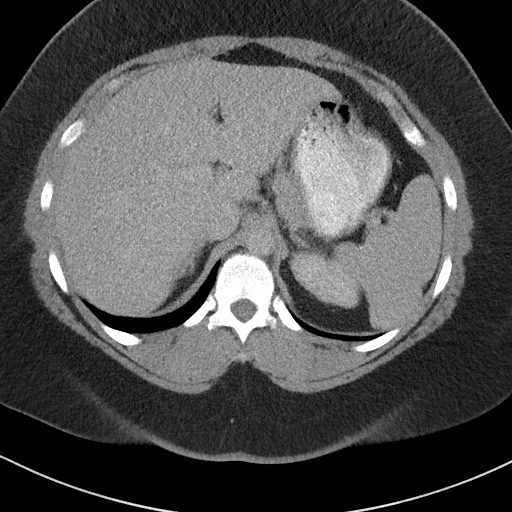
[im 79/94  soft-tissue]
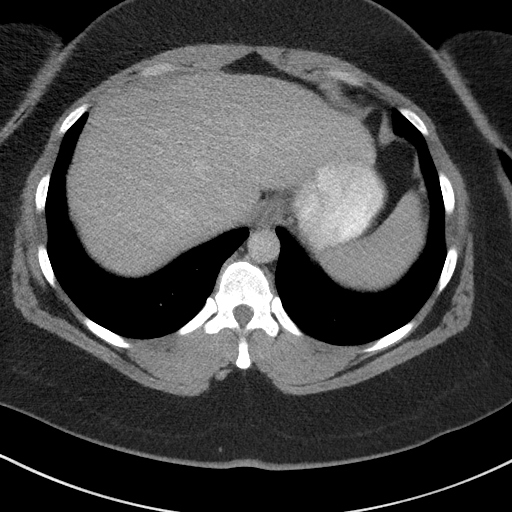
[im 86/94  soft-tissue]
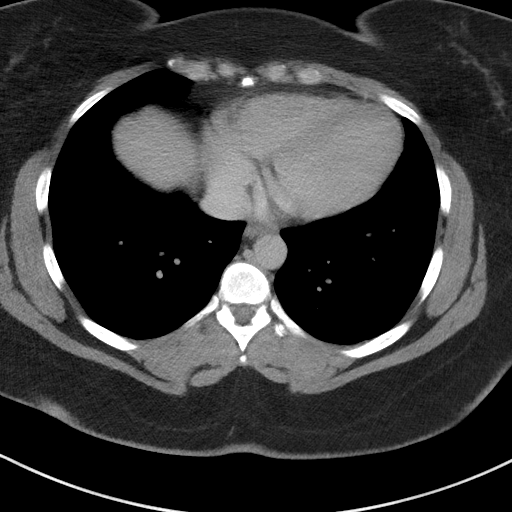

[Series 4: coronal st · coronal · 0.76mm/px · 3 of 87 slices shown]
[im 29/87  soft-tissue]
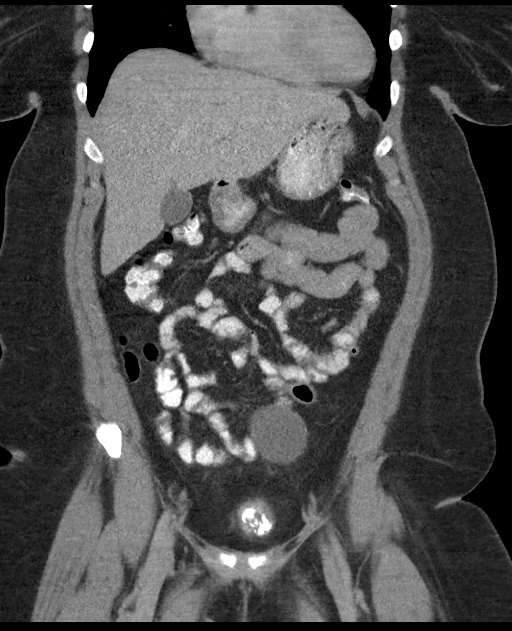
[im 39/87  soft-tissue]
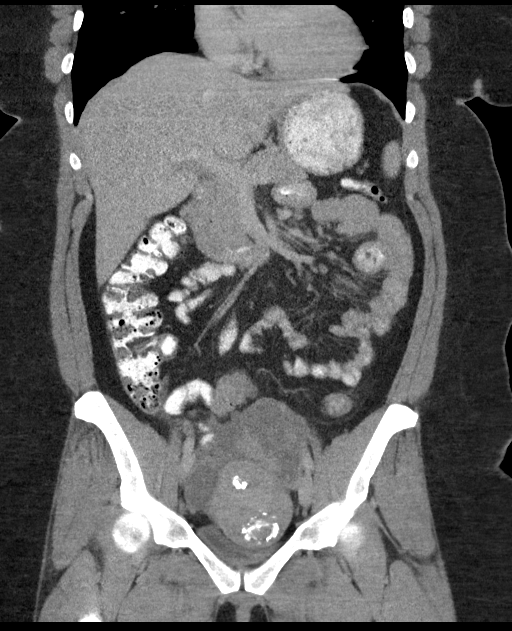
[im 48/87  soft-tissue]
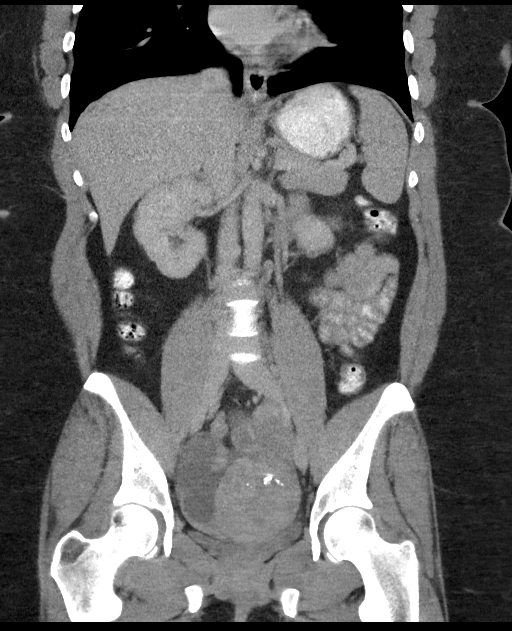

[15 of 46 positions shown; findings below may reference images not displayed]

FINDINGS: Lower chest: No signs of consolidation or evidence of pleural
effusion. Heart is incompletely imaged, imaged portions
unremarkable.

Hepatobiliary: No sign of focal hepatic mass. Portal vein is patent.

No signs of biliary ductal dilation or pericholecystic inflammation.

Pancreas: Unremarkable. No pancreatic ductal dilatation or
surrounding inflammatory changes.

Spleen: Normal in size without focal abnormality.

Adrenals/Urinary Tract: No signs of adrenal lesion.

Kidneys without signs of hydronephrosis. Symmetric renal
enhancement. Distal ureters inseparable from complex bilateral
cystic pelvic masses.

Stomach/Bowel: No sign of acute gastrointestinal process. Bowel
displaced by enlarged uterus and cystic changes in the bilateral
adnexa.

Vascular/Lymphatic: No signs of atherosclerotic change or aneurysm
in the abdominal aorta.

9 mm right common iliac lymph node and scattered smaller lymph nodes
in the retroperitoneum. No overt pelvic adenopathy.

Reproductive: Enlarged fibroid uterus. Cystic left adnexal mass
measures approximately 8 by 6.0 cm with both cystic and solid
components. Also with cystic changes in the right adnexa and in the
cul-de-sac.

On the right cystic areas measure approximately 8 by 4.7 cm. Part of
the area is serpiginous raising the question of tubal distension.

Other: Small amount of free fluid, nonspecific seen in the pelvis.

Musculoskeletal: No signs of acute bone finding or destructive bone
process.
IMPRESSION: 1. Complex cystic bilateral adnexal masses. Findings suspicious for
ovarian neoplasm. Surgical consultation suggested. Additional
differential considerations given patient age and appearance would
be endometriosis; however, findings are nonspecific. MRI could be
helpful in this regard to assess further as clinically warranted.
2. Question of hydrosalpinx particularly on the right.
3. Multiple calcified uterine fibroids.
4. No frank adenopathy but with mild enlargement of the right common
iliac node. Attention on follow-up.
5. Trace free fluid in the pelvis without frank signs of peritoneal
nodularity at this time.

## 2023-01-13 LAB — AMB RESULTS CONSOLE CBG: Glucose: 80

## 2023-01-23 NOTE — Progress Notes (Signed)
Patient does not have a PCP provider information provided.  No SDOH needs at this time.  Patient educated on elevated blood pressure

## 2023-01-31 ENCOUNTER — Encounter: Payer: Self-pay | Admitting: *Deleted

## 2023-01-31 NOTE — Progress Notes (Signed)
Pt attended 01/13/23 screening event where her b/p was 151/98 and her blood sugar was 80. At the event, the pt documented she did not have a PCP, although she had private insurance and that she did not have any SDOH insecurities. Chart review indicates pt has had ongoing care with [facility] Dr. Belva Agee but there is no PCP appt info visible in [facility]. Calls made to pt to verify access to PCP but mobile and home phone # is not valid (belongs to someone else). Letter sent to pt offering [facility] flyer in case needed and [facility] and [facility] info since pt lives in [place].

## 2023-04-12 ENCOUNTER — Encounter: Payer: Self-pay | Admitting: *Deleted

## 2023-04-12 NOTE — Progress Notes (Signed)
Pt attended 01/13/23 screening event where her b/p was 151/98 and her blood sugar was 80. At the event, the pt did not identify a PCP or any SDOH insecurities. During initial event, health equity team member was unable to contact pt by phone and letter sent offering PCP options, including [facility] since pt lives in [place]. During this 60 day f/u, phone messages left and second letter sent with same PCP info ([facility], [facility] and [facility]) and Kyra Searles, where chart review indicates client was once a pt.

## 2023-07-10 ENCOUNTER — Encounter: Payer: Self-pay | Admitting: *Deleted

## 2023-07-10 NOTE — Progress Notes (Signed)
Pt attended 01/23/23 screening event where her b/p was 151/98 on recheck and her blood sugar was 80. At the event, the pt noted she had no PCP, did have insurance, did not smoke, and did not identify any SDOH insecurities. During initial and 60 day event f/u attempts, health equity team unable to contact pt by phone; today caller able to leave VM on last known phone number but not to contact pt directly.  Chart review does not reveal any PCP or other healthcare encounters visible in [facility] since the initial event. Final letter mailed to pt at last known address in [place] with [facility] and [place] area PCP options in case she is still not connected to primary care. No additional health equity team support scheduled at this time.
